# Patient Record
Sex: Male | Born: 1965 | Race: White | Hispanic: No | Marital: Married | Smoking: Never smoker
Health system: Southern US, Community
[De-identification: ages and names within clinical notes are randomized; demographics above are authoritative.]

## PROBLEM LIST (undated history)

## (undated) DIAGNOSIS — E785 Hyperlipidemia, unspecified: Secondary | ICD-10-CM

## (undated) DIAGNOSIS — E559 Vitamin D deficiency, unspecified: Secondary | ICD-10-CM

## (undated) DIAGNOSIS — R0989 Other specified symptoms and signs involving the circulatory and respiratory systems: Secondary | ICD-10-CM

## (undated) DIAGNOSIS — J309 Allergic rhinitis, unspecified: Secondary | ICD-10-CM

## (undated) HISTORY — DX: Vitamin D deficiency, unspecified: E55.9

## (undated) HISTORY — DX: Allergic rhinitis, unspecified: J30.9

## (undated) HISTORY — DX: Hyperlipidemia, unspecified: E78.5

## (undated) HISTORY — DX: Other specified symptoms and signs involving the circulatory and respiratory systems: R09.89

---

## 1984-10-29 HISTORY — PX: ANTERIOR CRUCIATE LIGAMENT REPAIR: SHX115

## 2001-10-09 ENCOUNTER — Ambulatory Visit (HOSPITAL_COMMUNITY): Admission: RE | Admit: 2001-10-09 | Discharge: 2001-10-09 | Payer: Self-pay | Admitting: Gastroenterology

## 2005-10-29 HISTORY — PX: ORIF ANKLE FRACTURE: SUR919

## 2006-09-21 ENCOUNTER — Inpatient Hospital Stay (HOSPITAL_COMMUNITY): Admission: EM | Admit: 2006-09-21 | Discharge: 2006-09-24 | Payer: Self-pay | Admitting: Emergency Medicine

## 2007-12-12 IMAGING — CR DG ANKLE 2V *R*
2 series · 2 of 2 positions shown · non-contrast
Comparison: none

CLINICAL DATA: Right ankle pain and deformity following an injury.

RIGHT ANKLE - 2 VIEW

[view not recorded (1 of 2)]
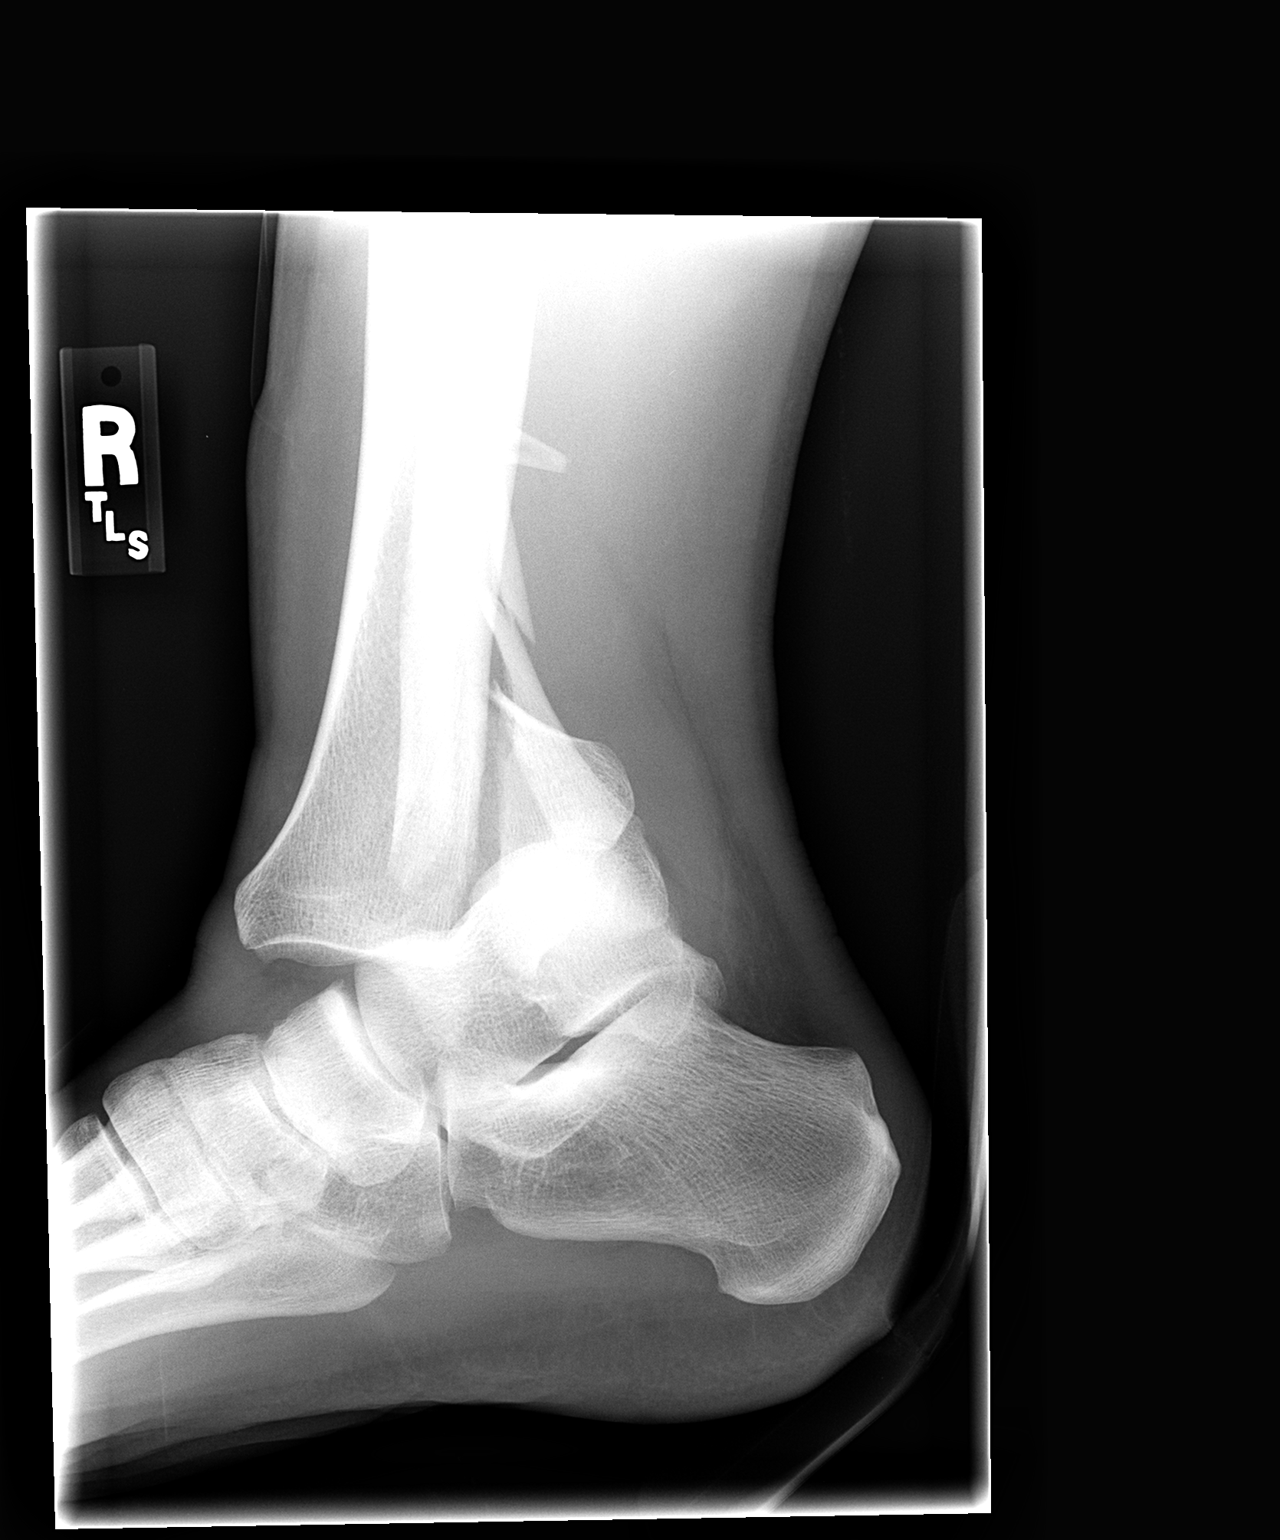

[view not recorded (2 of 2)]
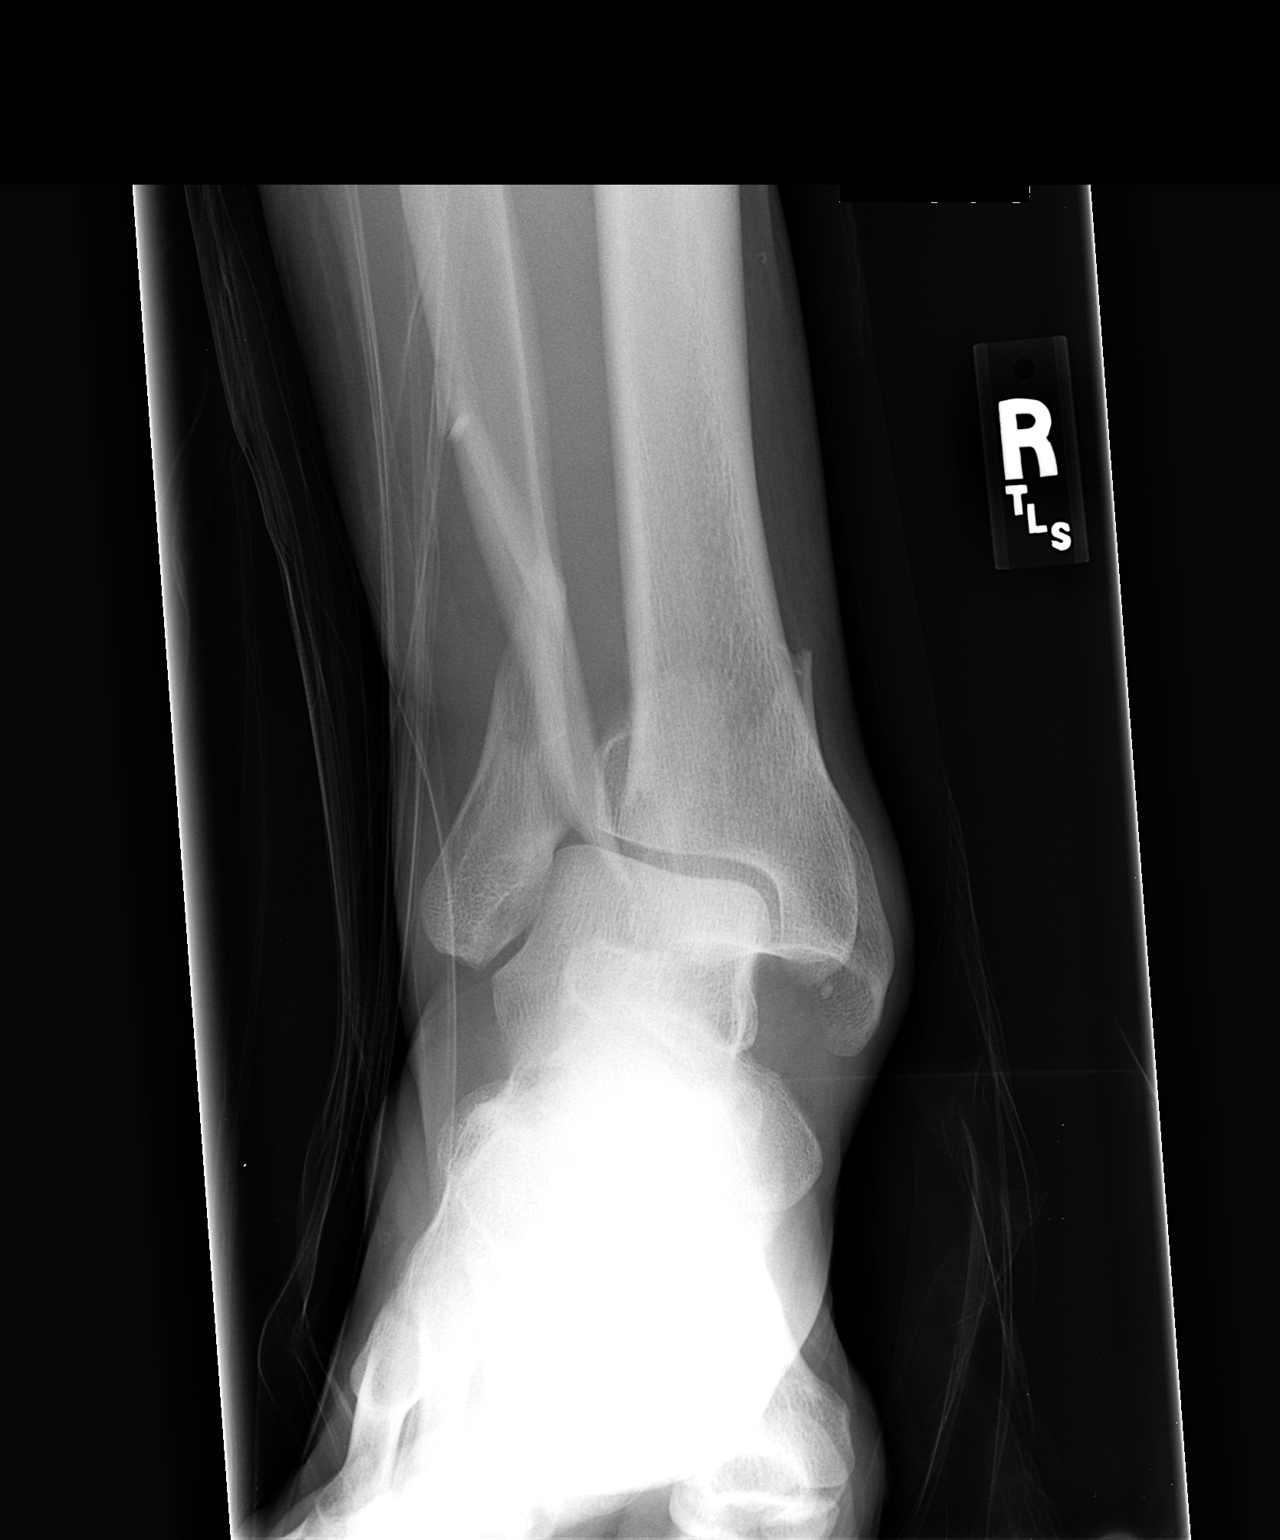

[2 of 2 positions shown; findings below may reference images not displayed]

FINDINGS: Comminuted fracture of the posterior and lateral aspects of the
distal tibial metaphysis and epiphysis with posterior dislocation of the talus
relative to the tibia, with overlapping of the talus and tibia. Comminuted
fracture of the distal shaft of the fibula with more than one shaft width of
posterior displacement as well as posterior displacement and lateral angulation
of the distal fragment. There is also significant overlapping of the fragments.

IMPRESSION

Comminuted distal tibia and fibula fractures as well as a talotibial
dislocation, as described above.

## 2007-12-12 IMAGING — CR DG CHEST 1V PORT
1 series · 1 of 1 positions shown · non-contrast
Comparison: None.

CLINICAL DATA: Ankle fracture. Preoperative evaluation.

PORTABLE CHEST - 1 VIEW

[view not recorded]
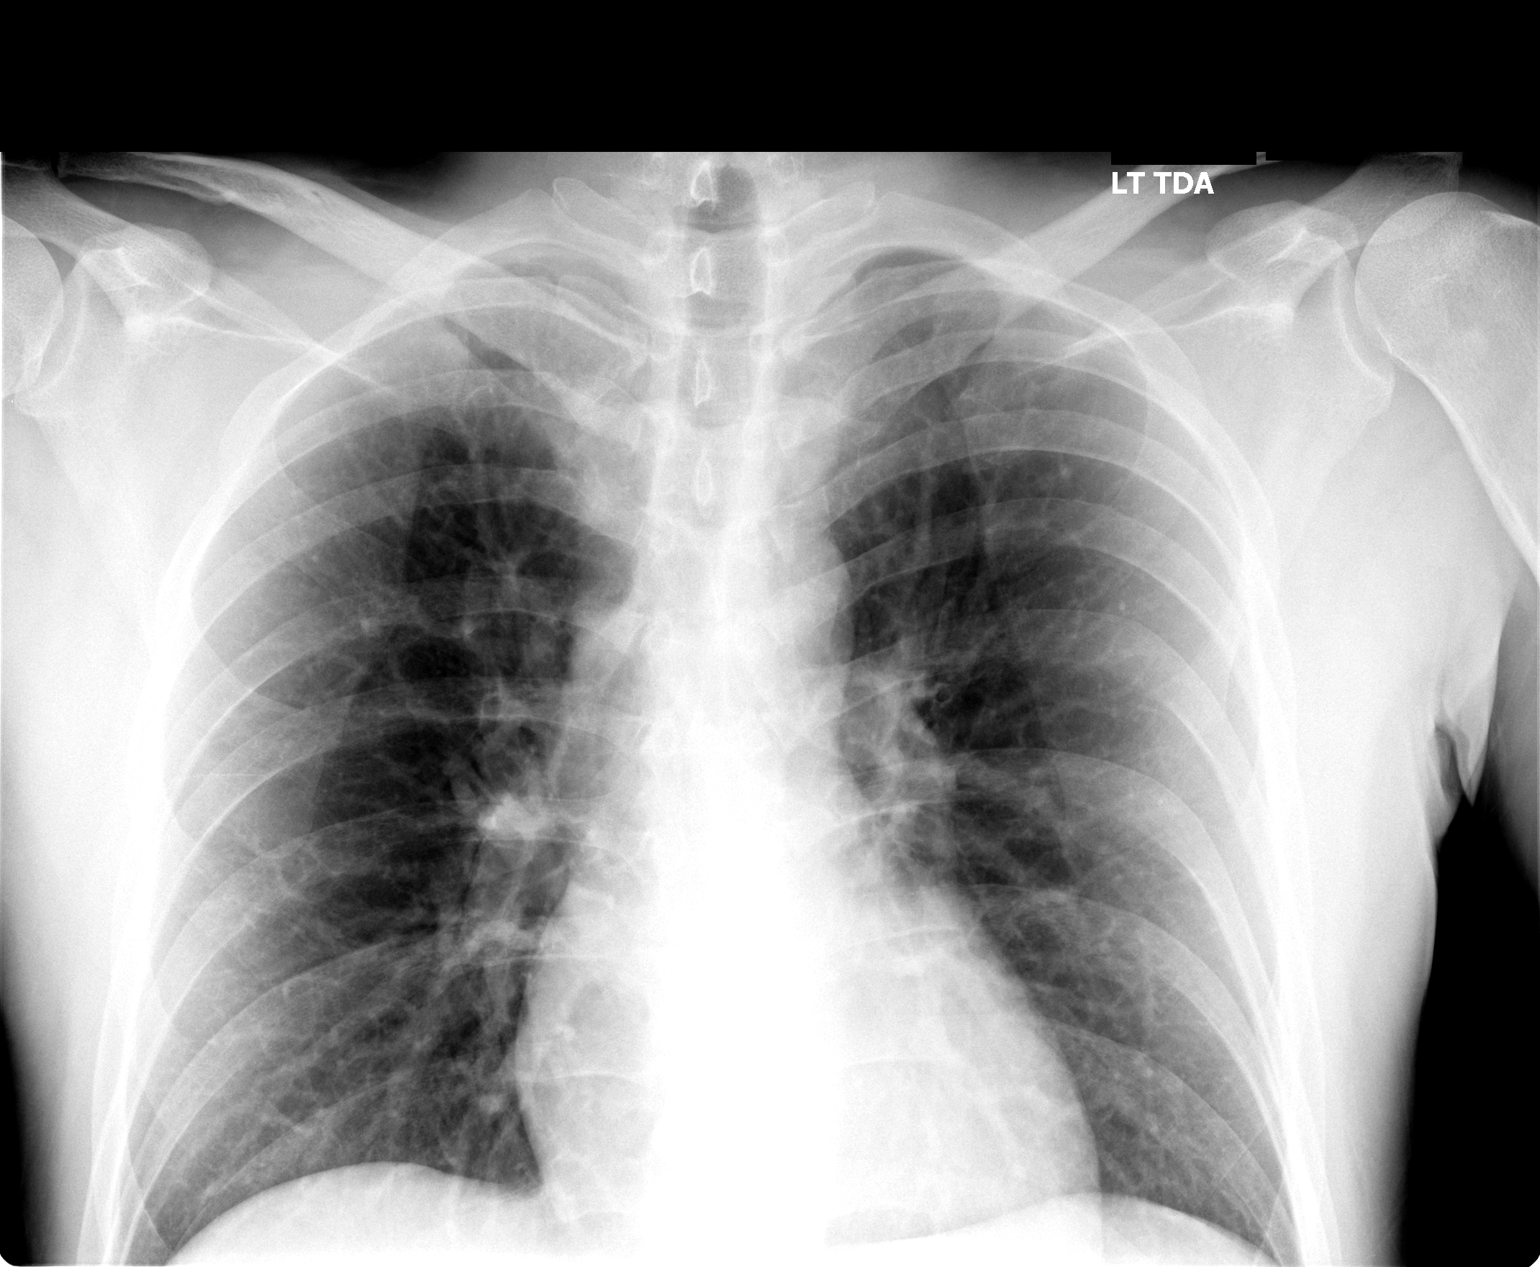

[1 of 1 positions shown; findings below may reference images not displayed]

FINDINGS: The lateral costophrenic angles are not included. The included
portions of the lungs are clear. Mild diffuse peribronchial thickening. Normal
sized heart. Mild scoliosis.

IMPRESSION

Mildly limited examination with no acute abnormality seen. Mild chronic
bronchitic changes.

## 2012-06-24 LAB — HM COLONOSCOPY: HM COLON: NORMAL

## 2013-11-10 ENCOUNTER — Ambulatory Visit: Payer: Self-pay | Admitting: Emergency Medicine

## 2014-08-08 DIAGNOSIS — E559 Vitamin D deficiency, unspecified: Secondary | ICD-10-CM | POA: Insufficient documentation

## 2014-08-08 DIAGNOSIS — R0989 Other specified symptoms and signs involving the circulatory and respiratory systems: Secondary | ICD-10-CM | POA: Insufficient documentation

## 2014-08-08 DIAGNOSIS — J309 Allergic rhinitis, unspecified: Secondary | ICD-10-CM | POA: Insufficient documentation

## 2014-08-08 DIAGNOSIS — E785 Hyperlipidemia, unspecified: Secondary | ICD-10-CM | POA: Insufficient documentation

## 2014-08-11 ENCOUNTER — Encounter: Payer: Self-pay | Admitting: Internal Medicine

## 2014-09-20 ENCOUNTER — Encounter: Payer: Self-pay | Admitting: Internal Medicine

## 2014-09-20 ENCOUNTER — Ambulatory Visit (INDEPENDENT_AMBULATORY_CARE_PROVIDER_SITE_OTHER): Payer: Commercial Managed Care - PPO | Admitting: Internal Medicine

## 2014-09-20 VITALS — BP 134/88 | HR 60 | Temp 97.7°F | Resp 16 | Ht 72.5 in | Wt 191.2 lb

## 2014-09-20 DIAGNOSIS — Z1212 Encounter for screening for malignant neoplasm of rectum: Secondary | ICD-10-CM

## 2014-09-20 DIAGNOSIS — E559 Vitamin D deficiency, unspecified: Secondary | ICD-10-CM

## 2014-09-20 DIAGNOSIS — Z113 Encounter for screening for infections with a predominantly sexual mode of transmission: Secondary | ICD-10-CM

## 2014-09-20 DIAGNOSIS — I10 Essential (primary) hypertension: Secondary | ICD-10-CM

## 2014-09-20 DIAGNOSIS — Z79899 Other long term (current) drug therapy: Secondary | ICD-10-CM

## 2014-09-20 DIAGNOSIS — E785 Hyperlipidemia, unspecified: Secondary | ICD-10-CM

## 2014-09-20 DIAGNOSIS — R7989 Other specified abnormal findings of blood chemistry: Secondary | ICD-10-CM

## 2014-09-20 DIAGNOSIS — Z125 Encounter for screening for malignant neoplasm of prostate: Secondary | ICD-10-CM

## 2014-09-20 DIAGNOSIS — Z111 Encounter for screening for respiratory tuberculosis: Secondary | ICD-10-CM

## 2014-09-20 DIAGNOSIS — R0989 Other specified symptoms and signs involving the circulatory and respiratory systems: Secondary | ICD-10-CM

## 2014-09-20 DIAGNOSIS — Z0001 Encounter for general adult medical examination with abnormal findings: Secondary | ICD-10-CM

## 2014-09-20 DIAGNOSIS — R7309 Other abnormal glucose: Secondary | ICD-10-CM

## 2014-09-20 DIAGNOSIS — R6889 Other general symptoms and signs: Secondary | ICD-10-CM

## 2014-09-20 DIAGNOSIS — R945 Abnormal results of liver function studies: Secondary | ICD-10-CM

## 2014-09-20 DIAGNOSIS — Z23 Encounter for immunization: Secondary | ICD-10-CM

## 2014-09-20 NOTE — Progress Notes (Signed)
Patient ID: Patrick LimingMichael B Stone, male   DOB: 05/29/1966, 48 y.o.   MRN: [id number]  Annual Screening Comprehensive Examination  This very nice 48 y.o.MWM presents for complete physical.  Patient has been followed for HTN,   Prediabetes, Hyperlipidemia, and Vitamin D Deficiency.   Patient has hx/o labile HTN predates since 2007 and has been monitored expectantly. Patient's BP has been controlled and today's BP: 134/88 mmHg. Patient reports [facility]'s have been [facility] 2-3 months and usually about 120/70's. He does exercise about 3-4 x/week. Patient denies any cardiac symptoms as chest pain, palpitations, shortness of breath, dizziness or ankle swelling.   Patient's lipids are controlled with diet and exercise.  Last lipids were Total Chol 188, TG 96, HDL 52 and elevated LDL 117 in Oct 2014.    Patient is screened and monitored for prediabetes and patient denies reactive hypoglycemic symptoms, visual blurring, diabetic polys or paresthesias.  Last A1c was 5.4% in Oct 2014     Finally, patient has history of Vitamin D Deficiency of 27 in 2008  and last vitamin D was 6438 in Oct 2014.   Medication Sig  . Cholecalciferol (VITAMIN D PO) Take by mouth daily.  Marland Kitchen. loratadine (CLARITIN) 10 MG tablet Take 10 mg by mouth daily.  . Multiple Vitamins-Minerals  Take by mouth daily.   No Known Allergies  Past Medical History  Diagnosis Date  . Vitamin D deficiency   . Allergic rhinitis   . Labile hypertension   . Elevated lipids    Health Maintenance  Topic Date Due  . TETANUS/TDAP  08/29/1984  . INFLUENZA VACCINE  05/29/2014   Immunization History  Administered Date(s) Administered  . Influenza Split 09/20/2014  . Influenza-Unspecified 08/07/2013  . PPD Test 09/20/2014   Past Surgical History  Procedure Laterality Date  . Orif ankle fracture Right 2007  . Anterior cruciate ligament repair Left 1986   Family History  Problem Relation Age of Onset  . Depression Mother    . Cancer Father     colon   History   Social History  . Marital Status: Married    Spouse Name: N/A    Number of Children: N/A  . Years of Education: N/A   Occupational History  . 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   Social History Main Topics  . Smoking status: Former Games developermoker  . Smokeless tobacco: Not on file  . Alcohol Use: Yes     Comment: Social  . Drug Use: No  . Sexual Activity: Not on file    ROS Constitutional: Denies fever, chills, weight loss/gain, headaches, insomnia, fatigue, night sweats or change in appetite. Eyes: Denies redness, blurred vision, diplopia, discharge, itchy or watery eyes.  ENT: Denies discharge, congestion, post nasal drip, epistaxis, sore throat, earache, hearing loss, dental pain, Tinnitus, Vertigo, Sinus pain or snoring.  Cardio: Denies chest pain, palpitations, irregular heartbeat, syncope, dyspnea, diaphoresis, orthopnea, PND, claudication or edema Respiratory: denies cough, dyspnea, DOE, pleurisy, hoarseness, laryngitis or wheezing.  Gastrointestinal: Denies dysphagia, heartburn, reflux, water brash, pain, cramps, nausea, vomiting, bloating, diarrhea, constipation, hematemesis, melena, hematochezia, jaundice or hemorrhoids Genitourinary: Denies dysuria, frequency, urgency, nocturia, hesitancy, discharge, hematuria or flank pain Musculoskeletal: Denies arthralgia, myalgia, stiffness, Jt. Swelling, pain, limp or strain/sprain. Denies Falls. Skin: Denies puritis, rash, hives, warts, acne, eczema or change in skin lesion Neuro: No weakness, tremor, incoordination, spasms, paresthesia or pain Psychiatric: Denies confusion, memory loss or sensory loss. Denies Depression. Endocrine: Denies change  in weight, skin, hair change, nocturia, and paresthesia, diabetic polys, visual blurring or hyper / hypo glycemic episodes.  Heme/Lymph: No excessive bleeding, bruising or enlarged lymph nodes.  Physical Exam  BP 134/88   Pulse 60  Temp 97.7  F   Resp 16  Ht 6' 0.5"   Wt 191 lb 3.2 oz   BMI 25.56   General Appearance: Well nourished, in no apparent distress. Eyes: PERRLA, EOMs, conjunctiva no swelling or erythema, normal fundi and vessels. Sinuses: No frontal/maxillary tenderness ENT/Mouth: EACs patent / TMs  nl. Nares clear without erythema, swelling, mucoid exudates. Oral hygiene is good. No erythema, swelling, or exudate. Tongue normal, non-obstructing. Tonsils not swollen or erythematous. Hearing normal.  Neck: Supple, thyroid normal. No bruits, nodes or JVD. Respiratory: Respiratory effort normal.  BS equal and clear bilateral without rales, rhonci, wheezing or stridor. Cardio: Heart sounds are normal with regular rate and rhythm and no murmurs, rubs or gallops. Peripheral pulses are normal and equal bilaterally without edema. No aortic or femoral bruits. Chest: symmetric with normal excursions and percussion.  Abdomen: Flat, soft, with bowl sounds. Nontender, no guarding, rebound, hernias, masses, or organomegaly.  Lymphatics: Non tender without lymphadenopathy.  Genitourinary: No hernias.Testes nl. DRE - prostate nl for age - smooth & firm w/o nodules. Musculoskeletal: Full ROM all peripheral extremities, joint stability, 5/5 strength, and normal gait. Skin: Warm and dry without rashes, lesions, cyanosis, clubbing or  ecchymosis.  Neuro: Cranial nerves intact, reflexes equal bilaterally. Normal muscle tone, no cerebellar symptoms. Sensation intact.  Pysch: Awake and oriented X 3with normal affect, insight and judgment appropriate.   Assessment and Plan  1. Annual Screening Examination 2. Hypertension, labile - monitoring expectantly  3. Hyperlipidemia, screening 4. Pre Diabetes, screening 5. Vitamin D Deficiency   Continue prudent diet as discussed, weight control, BP monitoring, regular exercise, and medications as discussed.  Discussed med effects and SE's. Routine screening labs and tests as requested with regular  follow-up as recommended.

## 2014-09-20 NOTE — Patient Instructions (Signed)

## 2014-09-21 LAB — CBC WITH DIFFERENTIAL/PLATELET
BASOS ABS: 0 10*3/uL (ref 0.0–0.1)
BASOS PCT: 0 % (ref 0–1)
Eosinophils Absolute: 0.1 10*3/uL (ref 0.0–0.7)
Eosinophils Relative: 2 % (ref 0–5)
HCT: 37.3 % — ABNORMAL LOW (ref 39.0–52.0)
HEMOGLOBIN: 13.1 g/dL (ref 13.0–17.0)
Lymphocytes Relative: 29 % (ref 12–46)
Lymphs Abs: 1.5 10*3/uL (ref 0.7–4.0)
MCH: 31.1 pg (ref 26.0–34.0)
MCHC: 35.1 g/dL (ref 30.0–36.0)
MCV: 88.6 fL (ref 78.0–100.0)
MPV: 10.2 fL (ref 9.4–12.4)
Monocytes Absolute: 0.4 10*3/uL (ref 0.1–1.0)
Monocytes Relative: 7 % (ref 3–12)
NEUTROS ABS: 3.3 10*3/uL (ref 1.7–7.7)
NEUTROS PCT: 62 % (ref 43–77)
PLATELETS: 258 10*3/uL (ref 150–400)
RBC: 4.21 MIL/uL — ABNORMAL LOW (ref 4.22–5.81)
RDW: 12.8 % (ref 11.5–15.5)
WBC: 5.3 10*3/uL (ref 4.0–10.5)

## 2014-09-21 LAB — BASIC METABOLIC PANEL WITH GFR
BUN: 18 mg/dL (ref 6–23)
CALCIUM: 9.3 mg/dL (ref 8.4–10.5)
CO2: 24 mEq/L (ref 19–32)
Chloride: 102 mEq/L (ref 96–112)
Creat: 0.93 mg/dL (ref 0.50–1.35)
GFR, Est Non African American: >89 mL/min
Glucose, Bld: 94 mg/dL (ref 70–99)
Potassium: 4.6 mEq/L (ref 3.5–5.3)
SODIUM: 138 meq/L (ref 135–145)

## 2014-09-21 LAB — HEPATIC FUNCTION PANEL
ALT: 14 U/L (ref 0–53)
AST: 20 U/L (ref 0–37)
Albumin: 4.3 g/dL (ref 3.5–5.2)
Alkaline Phosphatase: 45 U/L (ref 39–117)
BILIRUBIN DIRECT: 0.1 mg/dL (ref 0.0–0.3)
BILIRUBIN TOTAL: 0.7 mg/dL (ref 0.2–1.2)
Indirect Bilirubin: 0.6 mg/dL (ref 0.2–1.2)
Total Protein: 6.9 g/dL (ref 6.0–8.3)

## 2014-09-21 LAB — URINALYSIS, MICROSCOPIC ONLY
Bacteria, UA: NONE SEEN
Casts: NONE SEEN
Crystals: NONE SEEN
Squamous Epithelial / LPF: NONE SEEN

## 2014-09-21 LAB — HEPATITIS A ANTIBODY, TOTAL: Hep A Total Ab: BORDERLINE — AB

## 2014-09-21 LAB — LIPID PANEL
CHOL/HDL RATIO: 3.4 ratio
CHOLESTEROL: 182 mg/dL (ref 0–200)
HDL: 53 mg/dL (ref >39)
LDL Cholesterol: 104 mg/dL — ABNORMAL HIGH (ref 0–99)
Triglycerides: 127 mg/dL (ref <150)
VLDL: 25 mg/dL (ref 0–40)

## 2014-09-21 LAB — MICROALBUMIN / CREATININE URINE RATIO: CREATININE, URINE: 55.3 mg/dL

## 2014-09-21 LAB — HEPATITIS B SURFACE ANTIBODY,QUALITATIVE: HEP B S AB: NEGATIVE

## 2014-09-21 LAB — PSA: PSA: 0.69 ng/mL (ref <=4.00)

## 2014-09-21 LAB — HEMOGLOBIN A1C
HEMOGLOBIN A1C: 5.9 % — AB (ref <5.7)
Mean Plasma Glucose: 123 mg/dL — ABNORMAL HIGH (ref <117)

## 2014-09-21 LAB — RPR

## 2014-09-21 LAB — MAGNESIUM: MAGNESIUM: 1.9 mg/dL (ref 1.5–2.5)

## 2014-09-21 LAB — HEPATITIS B CORE ANTIBODY, TOTAL: Hep B Core Total Ab: NONREACTIVE

## 2014-09-21 LAB — INSULIN, FASTING: INSULIN FASTING, SERUM: 15.7 u[IU]/mL (ref 2.0–19.6)

## 2014-09-21 LAB — TSH: TSH: 1.135 u[IU]/mL (ref 0.350–4.500)

## 2014-09-21 LAB — VITAMIN B12: Vitamin B-12: 574 pg/mL (ref 211–911)

## 2014-09-21 LAB — VITAMIN D 25 HYDROXY (VIT D DEFICIENCY, FRACTURES): VIT D 25 HYDROXY: 48 ng/mL (ref 30–100)

## 2014-09-21 LAB — HIV ANTIBODY (ROUTINE TESTING W REFLEX): HIV: NONREACTIVE

## 2014-09-21 LAB — HEPATITIS C ANTIBODY: HCV Ab: NEGATIVE

## 2014-09-21 LAB — TESTOSTERONE: Testosterone: 310 ng/dL (ref 300–890)

## 2014-09-25 LAB — HEPATITIS B E ANTIBODY: HEPATITIS BE ANTIBODY: NONREACTIVE

## 2014-10-07 LAB — TB SKIN TEST
Induration: 0 mm
TB SKIN TEST: NEGATIVE

## 2015-08-22 ENCOUNTER — Encounter: Payer: Self-pay | Admitting: Internal Medicine

## 2015-10-05 ENCOUNTER — Encounter: Payer: Self-pay | Admitting: Internal Medicine

## 2015-10-05 ENCOUNTER — Ambulatory Visit (INDEPENDENT_AMBULATORY_CARE_PROVIDER_SITE_OTHER): Payer: BLUE CROSS/BLUE SHIELD | Admitting: Internal Medicine

## 2015-10-05 VITALS — BP 118/72 | HR 72 | Temp 97.7°F | Resp 16 | Ht 72.0 in | Wt 182.0 lb

## 2015-10-05 DIAGNOSIS — Z6824 Body mass index (BMI) 24.0-24.9, adult: Secondary | ICD-10-CM

## 2015-10-05 DIAGNOSIS — E663 Overweight: Secondary | ICD-10-CM | POA: Insufficient documentation

## 2015-10-05 DIAGNOSIS — Z0000 Encounter for general adult medical examination without abnormal findings: Secondary | ICD-10-CM | POA: Diagnosis not present

## 2015-10-05 DIAGNOSIS — R5383 Other fatigue: Secondary | ICD-10-CM

## 2015-10-05 DIAGNOSIS — Z79899 Other long term (current) drug therapy: Secondary | ICD-10-CM | POA: Diagnosis not present

## 2015-10-05 DIAGNOSIS — I10 Essential (primary) hypertension: Secondary | ICD-10-CM

## 2015-10-05 DIAGNOSIS — Z23 Encounter for immunization: Secondary | ICD-10-CM

## 2015-10-05 DIAGNOSIS — R0989 Other specified symptoms and signs involving the circulatory and respiratory systems: Secondary | ICD-10-CM

## 2015-10-05 DIAGNOSIS — Z0001 Encounter for general adult medical examination with abnormal findings: Secondary | ICD-10-CM

## 2015-10-05 DIAGNOSIS — Z125 Encounter for screening for malignant neoplasm of prostate: Secondary | ICD-10-CM

## 2015-10-05 DIAGNOSIS — E559 Vitamin D deficiency, unspecified: Secondary | ICD-10-CM

## 2015-10-05 DIAGNOSIS — Z111 Encounter for screening for respiratory tuberculosis: Secondary | ICD-10-CM | POA: Diagnosis not present

## 2015-10-05 DIAGNOSIS — R7309 Other abnormal glucose: Secondary | ICD-10-CM

## 2015-10-05 DIAGNOSIS — Z1212 Encounter for screening for malignant neoplasm of rectum: Secondary | ICD-10-CM

## 2015-10-05 DIAGNOSIS — E785 Hyperlipidemia, unspecified: Secondary | ICD-10-CM

## 2015-10-05 LAB — CBC WITH DIFFERENTIAL/PLATELET
BASOS ABS: 0.1 10*3/uL (ref 0.0–0.1)
Basophils Relative: 1 % (ref 0–1)
Eosinophils Absolute: 0.1 10*3/uL (ref 0.0–0.7)
Eosinophils Relative: 2 % (ref 0–5)
HEMATOCRIT: 38 % — AB (ref 39.0–52.0)
HEMOGLOBIN: 12.9 g/dL — AB (ref 13.0–17.0)
LYMPHS ABS: 1.5 10*3/uL (ref 0.7–4.0)
LYMPHS PCT: 28 % (ref 12–46)
MCH: 30.9 pg (ref 26.0–34.0)
MCHC: 33.9 g/dL (ref 30.0–36.0)
MCV: 90.9 fL (ref 78.0–100.0)
MONO ABS: 0.4 10*3/uL (ref 0.1–1.0)
MONOS PCT: 8 % (ref 3–12)
MPV: 10.8 fL (ref 8.6–12.4)
NEUTROS ABS: 3.3 10*3/uL (ref 1.7–7.7)
Neutrophils Relative %: 61 % (ref 43–77)
Platelets: 223 10*3/uL (ref 150–400)
RBC: 4.18 MIL/uL — AB (ref 4.22–5.81)
RDW: 13 % (ref 11.5–15.5)
WBC: 5.4 10*3/uL (ref 4.0–10.5)

## 2015-10-05 LAB — TSH: TSH: 1.625 u[IU]/mL (ref 0.350–4.500)

## 2015-10-05 LAB — VITAMIN B12: VITAMIN B 12: 420 pg/mL (ref 211–911)

## 2015-10-05 NOTE — Patient Instructions (Signed)

## 2015-10-05 NOTE — Progress Notes (Signed)
Patient ID: Patrick LimingMichael B Evora, male   DOB: 09/01/1966, 49 y.o.   MRN: [id number]  Annual  Screening/Preventative Visit And Comprehensive Evaluation & Examination  This very nice 49 y.o. MWM presents for presents for a Wellness/Preventative Visit & comprehensive evaluation and management of multiple medical co-morbidities.  Patient has been followed for Labile HTN, Prediabetes, Hyperlipidemia and Vitamin D Deficiency. Patient also has hx/o GERD only very infrequently symptomatic and usually controlled with TUMs.      Labile HTN predates since 2006 and patient is followed expectantly. Patient's BP has been controlled and today's BP: 118/72 mmHg. Patient denies any cardiac symptoms as chest pain, palpitations, shortness of breath, dizziness or ankle swelling.  Patient exercises 3-5 x/week.      Patient's hyperlipidemia is controlled with diet. Patient denies myalgias or other medication SE's. Last lipids were near  goal with Total Chol 182, HDL 53, Trig 127 & LDL 104 in Nov 2015.      Patient has  prediabetes  and patient denies reactive hypoglycemic symptoms, visual blurring, diabetic polys or paresthesias. Last A1c was 5.9% in Non 2015.       Finally, patient has history of Vitamin D Deficiency of 27 in 2008 and last vitamin D was 48 in Nov 2015.    Medication Sig  . Cholecalciferol (VITAMIN D PO) Take by mouth daily.  Marland Kitchen. loratadine (CLARITIN) 10 MG tablet Take 10 mg by mouth daily.  . Multiple Vitamins-Minerals (MULTIVITAMIN PO) Take by mouth daily.   No Known Allergies   Past Medical History  Diagnosis Date  . Vitamin D deficiency   . Allergic rhinitis   . Labile hypertension   . Elevated lipids    Health Maintenance  Topic Date Due  . TETANUS/TDAP  08/29/1984  . INFLUENZA VACCINE  05/30/2015  . COLONOSCOPY  06/24/2022  . HIV Screening  Completed   Immunization History  Administered Date(s) Administered  . Influenza Split 09/20/2014  . Influenza-Unspecified 08/07/2013  . PPD Test  09/20/2014   Past Surgical History  Procedure Laterality Date  . Orif ankle fracture Right 2007  . Anterior cruciate ligament repair Left 1986   Social History   Social History  . Marital Status: Married    Spouse Name: N/A  . Number of Children: N/A  . Years of Education: N/A   Occupational History  . 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 Smoking status: Former Games developermoker  . Smokeless tobacco: No  . Alcohol Use: Yes     Comment: Social  . Drug Use: No  . Sexual Activity: Active    ROS Constitutional: Denies fever, chills, weight loss/gain, headaches, insomnia,  night sweats or change in appetite. Does c/o fatigue. Eyes: Denies redness, blurred vision, diplopia, discharge, itchy or watery eyes.  ENT: Denies discharge, congestion, post nasal drip, epistaxis, sore throat, earache, hearing loss, dental pain, Tinnitus, Vertigo, Sinus pain or snoring.  Cardio: Denies chest pain, palpitations, irregular heartbeat, syncope, dyspnea, diaphoresis, orthopnea, PND, claudication or edema Respiratory: denies cough, dyspnea, DOE, pleurisy, hoarseness, laryngitis or wheezing.  Gastrointestinal: Denies dysphagia, reflux, water brash, pain, cramps, nausea, vomiting, bloating, diarrhea, constipation, hematemesis, melena, hematochezia, jaundice or hemorrhoids. Has infreq heartburn. Genitourinary: Denies dysuria, frequency, urgency, nocturia, hesitancy, discharge, hematuria or flank pain Musculoskeletal: Denies arthralgia, myalgia, stiffness, Jt. Swelling, pain, limp or strain/sprain. Denies Falls. Skin: Denies puritis, rash, hives, warts, acne, eczema or change in skin lesion Neuro: No weakness, tremor, incoordination, spasms, paresthesia or pain  Psychiatric: Denies confusion, memory loss or sensory loss. Denies Depression. Endocrine: Denies change in weight, skin, hair change, nocturia, and paresthesia, diabetic polys, visual blurring or hyper / hypo glycemic  episodes.  Heme/Lymph: No excessive bleeding, bruising or enlarged lymph nodes.  Physical Exam  BP 118/72 mmHg  Pulse 72  Temp(Src) 97.7 F (36.5 C)  Resp 16  Ht 6' (1.829 m)  Wt 182 lb (82.555 kg)  BMI 24.68 kg/m2  General Appearance: Well nourished, in no apparent distress. Eyes: PERRLA, EOMs, conjunctiva no swelling or erythema, normal fundi and vessels. Sinuses: No frontal/maxillary tenderness ENT/Mouth: EACs patent / TMs  nl. Nares clear without erythema, swelling, mucoid exudates. Oral hygiene is good. No erythema, swelling, or exudate. Tongue normal, non-obstructing. Tonsils not swollen or erythematous. Hearing normal.  Neck: Supple, thyroid normal. No bruits, nodes or JVD. Respiratory: Respiratory effort normal.  BS equal and clear bilateral without rales, rhonci, wheezing or stridor. Cardio: Heart sounds are normal with regular rate and rhythm and no murmurs, rubs or gallops. Peripheral pulses are normal and equal bilaterally without edema. No aortic or femoral bruits. Chest: symmetric with normal excursions and percussion.  Abdomen: Flat, soft, with bowl sounds. Nontender, no guarding, rebound, hernias, masses, or organomegaly.  Lymphatics: Non tender without lymphadenopathy.  Genitourinary: No hernias.Testes nl. DRE - prostate nl for age - smooth & firm w/o nodules. Musculoskeletal: Full ROM all peripheral extremities, joint stability, 5/5 strength, and normal gait. Skin: Warm and dry without rashes, lesions, cyanosis, clubbing or  ecchymosis.  Neuro: Cranial nerves intact, reflexes equal bilaterally. Normal muscle tone, no cerebellar symptoms. Sensation intact.  Pysch: Awake and oriented X 3 with normal affect, insight and judgment appropriate.   Assessment and Plan  1. Annual Preventative/Screening Exam   - Microalbumin / creatinine urine ratio - EKG 12-Lead - [place], RETROPERITNL ABD,  LTD - Urinalysis, Routine w reflex microscopic  - Vitamin B12 - Iron and TIBC -  PSA - Testosterone - CBC with Differential/Platelet - BASIC METABOLIC PANEL WITH GFR - Hepatic function panel - Magnesium - Lipid panel - TSH - Hemoglobin A1c - Insulin, random - VITAMIN D 25 Hydroxy   2. Labile hypertension  - Microalbumin / creatinine urine ratio - EKG 12-Lead - [place], RETROPERITNL ABD,  LTD - TSH  3. Elevated lipids  - Lipid panel - TSH  4. Abnormal glucose  - Hemoglobin A1c - Insulin, random  5. Vitamin D deficiency  - VITAMIN D 25 Hydroxy   6. Screening for rectal cancer   7. Prostate cancer screening  - PSA  8. Other fatigue  - Vitamin B12 - Iron and TIBC - Testosterone  9. Body mass index (BMI) of 24.0-24.9 in adult   10. Need for prophylactic vaccination and inoculation against influenza  - Flu vaccine greater than or equal to 3yo preservative free IM  11. Need for prophylactic vaccination with combined diphtheria-tetanus-pertussis (DTP) vaccine  - Tdap vaccine greater than or equal to 7yo IM  12. Screening examination for pulmonary tuberculosis  - PPD  13. Medication management  - Urinalysis, Routine w reflex microscopic (not at [facility]) - CBC with Differential/Platelet - BASIC METABOLIC PANEL WITH GFR - Hepatic function panel - Magnesium   Continue prudent diet as discussed, weight control, BP monitoring, regular exercise, and medications as discussed.  Discussed med effects and SE's. Routine screening labs and tests as requested with regular follow-up as recommended. Over 40 minutes of exam, counseling, chart review and high complex critical decision making was performed

## 2015-10-06 LAB — URINALYSIS, ROUTINE W REFLEX MICROSCOPIC
Bilirubin Urine: NEGATIVE
Glucose, UA: NEGATIVE
Hgb urine dipstick: NEGATIVE
Ketones, ur: NEGATIVE
LEUKOCYTES UA: NEGATIVE
NITRITE: NEGATIVE
PROTEIN: NEGATIVE
Specific Gravity, Urine: 1.022 (ref 1.001–1.035)
pH: 5.5 (ref 5.0–8.0)

## 2015-10-06 LAB — MICROALBUMIN / CREATININE URINE RATIO
Creatinine, Urine: 147 mg/dL (ref 20–370)
MICROALB/CREAT RATIO: 1 ug/mg{creat} (ref <30)
Microalb, Ur: 0.2 mg/dL

## 2015-10-06 LAB — BASIC METABOLIC PANEL WITH GFR
BUN: 21 mg/dL (ref 7–25)
CHLORIDE: 103 mmol/L (ref 98–110)
CO2: 25 mmol/L (ref 20–31)
Calcium: 9.2 mg/dL (ref 8.6–10.3)
Creat: 0.96 mg/dL (ref 0.70–1.33)
GLUCOSE: 82 mg/dL (ref 65–99)
POTASSIUM: 4.3 mmol/L (ref 3.5–5.3)
Sodium: 139 mmol/L (ref 135–146)

## 2015-10-06 LAB — HEMOGLOBIN A1C
Hgb A1c MFr Bld: 5.7 % — ABNORMAL HIGH (ref <5.7)
Mean Plasma Glucose: 117 mg/dL — ABNORMAL HIGH (ref <117)

## 2015-10-06 LAB — HEPATIC FUNCTION PANEL
ALK PHOS: 41 U/L (ref 40–115)
ALT: 20 U/L (ref 9–46)
AST: 22 U/L (ref 10–35)
Albumin: 3.9 g/dL (ref 3.6–5.1)
BILIRUBIN INDIRECT: 0.6 mg/dL (ref 0.2–1.2)
Bilirubin, Direct: 0.1 mg/dL (ref <=0.2)
TOTAL PROTEIN: 6.6 g/dL (ref 6.1–8.1)
Total Bilirubin: 0.7 mg/dL (ref 0.2–1.2)

## 2015-10-06 LAB — LIPID PANEL
CHOLESTEROL: 178 mg/dL (ref 125–200)
HDL: 62 mg/dL (ref >=40)
LDL Cholesterol: 93 mg/dL (ref <130)
TRIGLYCERIDES: 116 mg/dL (ref <150)
Total CHOL/HDL Ratio: 2.9 Ratio (ref <=5.0)
VLDL: 23 mg/dL (ref <30)

## 2015-10-06 LAB — MAGNESIUM: Magnesium: 2 mg/dL (ref 1.5–2.5)

## 2015-10-06 LAB — PSA: PSA: 0.66 ng/mL (ref <=4.00)

## 2015-10-06 LAB — INSULIN, RANDOM: Insulin: 13 u[IU]/mL (ref 2.0–19.6)

## 2015-10-06 LAB — IRON AND TIBC
%SAT: 26 % (ref 15–60)
IRON: 93 ug/dL (ref 50–180)
TIBC: 362 ug/dL (ref 250–425)
UIBC: 269 ug/dL (ref 125–400)

## 2015-10-06 LAB — VITAMIN D 25 HYDROXY (VIT D DEFICIENCY, FRACTURES): Vit D, 25-Hydroxy: 45 ng/mL (ref 30–100)

## 2015-10-06 LAB — TESTOSTERONE: Testosterone: 404 ng/dL (ref 300–890)

## 2015-10-09 ENCOUNTER — Encounter: Payer: Self-pay | Admitting: Internal Medicine

## 2015-10-20 ENCOUNTER — Encounter: Payer: Self-pay | Admitting: Internal Medicine

## 2015-10-20 ENCOUNTER — Ambulatory Visit (INDEPENDENT_AMBULATORY_CARE_PROVIDER_SITE_OTHER): Payer: BLUE CROSS/BLUE SHIELD | Admitting: Internal Medicine

## 2015-10-20 VITALS — BP 122/86 | HR 68 | Temp 98.0°F | Resp 16 | Ht 72.0 in | Wt 183.0 lb

## 2015-10-20 DIAGNOSIS — J069 Acute upper respiratory infection, unspecified: Secondary | ICD-10-CM

## 2015-10-20 MED ORDER — PROMETHAZINE-DM 6.25-15 MG/5ML PO SYRP: ORAL_SOLUTION | ORAL | Status: DC

## 2015-10-20 MED ORDER — FLUTICASONE PROPIONATE 50 MCG/ACT NA SUSP: 2.0000 | Freq: Every day | NASAL | Status: DC

## 2015-10-20 MED ORDER — PREDNISONE 20 MG PO TABS: ORAL_TABLET | ORAL | Status: DC

## 2015-10-20 NOTE — Progress Notes (Signed)
Patient ID: Patrick LimingMichael B Stone, male   DOB: 04/13/1966, 49 y.o.   MRN: [id number]  HPI  Patient presents to the office for evaluation of cough.  It has been going on for 10 days.  Patient reports night > day, dry, worse with lying down.  They also endorse change in voice, postnasal drip and nasal congestion, rhinorrhea, clear nasal sputum, sinus pressure, ear fullness.  They have tried mucinex and advil cold and sinus.  They report that nothing has worked.  They denies other sick contacts.  Review of Systems  Constitutional: Negative for fever, chills and malaise/fatigue.  HENT: Positive for congestion. Negative for ear pain and sore throat.   Respiratory: Positive for cough. Negative for sputum production, shortness of breath and wheezing.   Cardiovascular: Negative for chest pain, palpitations and leg swelling.  Neurological: Negative for headaches.    PE:  Filed Vitals:   10/20/15 1116  BP: 122/86  Pulse: 68  Temp: 98 F (36.7 C)  Resp: 16    General:  Alert and non-toxic, WDWN, NAD HEENT: NCAT, PERLA, EOM normal, no occular discharge or erythema.  Nasal mucosal edema with sinus tenderness to palpation.  Oropharynx clear with minimal oropharyngeal edema and erythema.  Mucous membranes moist and pink. Neck:  Cervical adenopathy Chest:  RRR no MRGs.  Lungs clear to auscultation A&P with no wheezes rhonchi or rales.   Abdomen: +BS x 4 quadrants, soft, non-tender, no guarding, rigidity, or rebound. Skin: warm and dry no rash Neuro: A&Ox4, CN II-XII grossly intact  Assessment and Plan:   1. Acute URI -nasal saline -phenergan dextromethorphan -claritin -prednisone -f/u prn

## 2015-10-20 NOTE — Patient Instructions (Signed)
Please start taking claritin daily again.  Please use flonase 2 sprays per nostril before bedtime until congestion resolves.  Please use cough syrup as needed.  Please take prednisone to help with symptoms.  You can stop it if you are feeling better.  Please use saline in your nose as often as possible.

## 2016-12-10 ENCOUNTER — Encounter: Payer: Self-pay | Admitting: Internal Medicine

## 2016-12-10 ENCOUNTER — Ambulatory Visit (INDEPENDENT_AMBULATORY_CARE_PROVIDER_SITE_OTHER): Payer: BLUE CROSS/BLUE SHIELD | Admitting: Internal Medicine

## 2016-12-10 VITALS — BP 122/72 | HR 72 | Temp 97.8°F | Resp 16 | Ht 73.0 in | Wt 195.4 lb

## 2016-12-10 DIAGNOSIS — Z79899 Other long term (current) drug therapy: Secondary | ICD-10-CM | POA: Diagnosis not present

## 2016-12-10 DIAGNOSIS — Z125 Encounter for screening for malignant neoplasm of prostate: Secondary | ICD-10-CM

## 2016-12-10 DIAGNOSIS — E559 Vitamin D deficiency, unspecified: Secondary | ICD-10-CM

## 2016-12-10 DIAGNOSIS — Z111 Encounter for screening for respiratory tuberculosis: Secondary | ICD-10-CM | POA: Diagnosis not present

## 2016-12-10 DIAGNOSIS — R0989 Other specified symptoms and signs involving the circulatory and respiratory systems: Secondary | ICD-10-CM

## 2016-12-10 DIAGNOSIS — Z0001 Encounter for general adult medical examination with abnormal findings: Secondary | ICD-10-CM

## 2016-12-10 DIAGNOSIS — I10 Essential (primary) hypertension: Secondary | ICD-10-CM

## 2016-12-10 DIAGNOSIS — Z0000 Encounter for general adult medical examination without abnormal findings: Secondary | ICD-10-CM

## 2016-12-10 DIAGNOSIS — R7309 Other abnormal glucose: Secondary | ICD-10-CM

## 2016-12-10 DIAGNOSIS — E782 Mixed hyperlipidemia: Secondary | ICD-10-CM

## 2016-12-10 DIAGNOSIS — Z1212 Encounter for screening for malignant neoplasm of rectum: Secondary | ICD-10-CM

## 2016-12-10 DIAGNOSIS — Z136 Encounter for screening for cardiovascular disorders: Secondary | ICD-10-CM | POA: Diagnosis not present

## 2016-12-10 DIAGNOSIS — R5383 Other fatigue: Secondary | ICD-10-CM

## 2016-12-10 LAB — CBC WITH DIFFERENTIAL/PLATELET
BASOS ABS: 38 {cells}/uL (ref 0–200)
Basophils Relative: 1 %
EOS ABS: 114 {cells}/uL (ref 15–500)
EOS PCT: 3 %
HCT: 37.7 % — ABNORMAL LOW (ref 38.5–50.0)
HEMOGLOBIN: 12.5 g/dL — AB (ref 13.2–17.1)
Lymphocytes Relative: 29 %
Lymphs Abs: 1102 cells/uL (ref 850–3900)
MCH: 31.2 pg (ref 27.0–33.0)
MCHC: 33.2 g/dL (ref 32.0–36.0)
MCV: 94 fL (ref 80.0–100.0)
MONOS PCT: 9 %
MPV: 10.8 fL (ref 7.5–12.5)
Monocytes Absolute: 342 cells/uL (ref 200–950)
NEUTROS ABS: 2204 {cells}/uL (ref 1500–7800)
Neutrophils Relative %: 58 %
PLATELETS: 212 10*3/uL (ref 140–400)
RBC: 4.01 MIL/uL — ABNORMAL LOW (ref 4.20–5.80)
RDW: 13.5 % (ref 11.0–15.0)
WBC: 3.8 10*3/uL (ref 3.8–10.8)

## 2016-12-10 LAB — BASIC METABOLIC PANEL WITH GFR
BUN: 21 mg/dL (ref 7–25)
CALCIUM: 9.4 mg/dL (ref 8.6–10.3)
CHLORIDE: 105 mmol/L (ref 98–110)
CO2: 27 mmol/L (ref 20–31)
CREATININE: 1.03 mg/dL (ref 0.70–1.33)
GFR, Est African American: >89 mL/min (ref >=60)
GFR, Est Non African American: 84 mL/min (ref >=60)
Glucose, Bld: 98 mg/dL (ref 65–99)
Potassium: 4.7 mmol/L (ref 3.5–5.3)
Sodium: 138 mmol/L (ref 135–146)

## 2016-12-10 LAB — HEPATIC FUNCTION PANEL
ALBUMIN: 4 g/dL (ref 3.6–5.1)
ALT: 15 U/L (ref 9–46)
AST: 20 U/L (ref 10–35)
Alkaline Phosphatase: 37 U/L — ABNORMAL LOW (ref 40–115)
Bilirubin, Direct: 0.1 mg/dL (ref <=0.2)
Indirect Bilirubin: 0.6 mg/dL (ref 0.2–1.2)
TOTAL PROTEIN: 6.4 g/dL (ref 6.1–8.1)
Total Bilirubin: 0.7 mg/dL (ref 0.2–1.2)

## 2016-12-10 LAB — LIPID PANEL
CHOLESTEROL: 177 mg/dL (ref <200)
HDL: 62 mg/dL (ref >40)
LDL Cholesterol: 95 mg/dL (ref <100)
Total CHOL/HDL Ratio: 2.9 Ratio (ref <5.0)
Triglycerides: 100 mg/dL (ref <150)
VLDL: 20 mg/dL (ref <30)

## 2016-12-10 LAB — IRON AND TIBC
%SAT: 23 % (ref 15–60)
IRON: 81 ug/dL (ref 50–180)
TIBC: 358 ug/dL (ref 250–425)
UIBC: 277 ug/dL (ref 125–400)

## 2016-12-10 LAB — VITAMIN B12: VITAMIN B 12: 450 pg/mL (ref 200–1100)

## 2016-12-10 LAB — PSA: PSA: 0.8 ng/mL (ref <=4.0)

## 2016-12-10 LAB — TSH: TSH: 1.93 m[IU]/L (ref 0.40–4.50)

## 2016-12-10 NOTE — Patient Instructions (Signed)

## 2016-12-10 NOTE — Progress Notes (Signed)
[facility]   Patrick Stone, M.D.    [name]. Steffanie Dunn, P.A.-C      Terri Piedra, P.A.-C  [facility]                [address]                [place], [place]. [address] Telephone [phone] Telefax [phone] Annual  Screening/Preventative Visit  & Comprehensive Evaluation & Examination     This very nice 51 y.o. MWM presents for a Screening/Preventative Visit & comprehensive evaluation and management of multiple medical co-morbidities.  Patient has been followed expectantly for labile HTN, Prediabetes, Hyperlipidemia and Vitamin D Deficiency.     Labile HTN predates circa 2006 and patient's BP has been controlled at home.  Today's BP is at goal - 122/72. Patient denies any cardiac symptoms as chest pain, palpitations, shortness of breath, dizziness or ankle swelling. Patient endorse exercising 7 days/week!     Patient's hyperlipidemia is controlled with diet and medications. Patient denies myalgias or other medication SE's. Last lipids were at goal.   Lab Results  Component Value Date   CHOL 178 10/05/2015   HDL 62 10/05/2015   LDLCALC 93 10/05/2015   TRIG 116 10/05/2015   CHOLHDL 2.9 10/05/2015      Patient has hx/o prediabetes  (A1c 5.9% in 2015) and patient denies reactive hypoglycemic symptoms, visual blurring, diabetic polys or paresthesias. Last A1c was  Lab Results  Component Value Date   HGBA1C 5.7 (H) 10/05/2015       Finally, patient has history of Vitamin D Deficiency ("27" in 2008)  and last vitamin D was still low (goal is 70-100): Lab Results  Component Value Date   VD25OH 45 10/05/2015   Current Outpatient Prescriptions on File Prior to Visit  Medication Sig  . Cholecalciferol (VITAMIN D PO) Take 10,000 Units by mouth daily.   Marland Kitchen loratadine (CLARITIN) 10 MG tablet Take 10 mg by mouth daily.  . Multiple Vitamins-Minerals (MULTIVITAMIN PO) Take by mouth daily.   No current  facility-administered medications on file prior to visit.    No Known Allergies Past Medical History:  Diagnosis Date  . Allergic rhinitis   . Elevated lipids   . Labile hypertension   . Vitamin D deficiency    Health Maintenance  Topic Date Due  . INFLUENZA VACCINE  05/29/2016  . COLONOSCOPY  06/24/2022  . TETANUS/TDAP  10/04/2025  . HIV Screening  Completed   Immunization History  Administered Date(s) Administered  . Influenza Split 09/20/2014  . Influenza, Seasonal, Injecte, Preservative Fre 10/05/2015  . Influenza-Unspecified 08/07/2013  . PPD Test 09/20/2014, 10/05/2015  . Tdap 10/05/2015   Past Surgical History:  Procedure Laterality Date  . ANTERIOR CRUCIATE LIGAMENT REPAIR Left 1986  . ORIF ANKLE FRACTURE Right 2007   Family History  Problem Relation Age of Onset  . Depression Mother   . Cancer Father     colon   Social History   Social History  . Marital status: Married    Spouse name: N/A  . Number of children: N/A  . Years of education: N/A   Occupational History  . Sales of loading dock equiptment x 24 years   Social History Main Topics  . Smoking status: Never Smoker  . Smokeless tobacco: Not on file  . Alcohol use 0.0 oz/week     Comment: Social  . Drug use: No  . Sexual activity: Not on  file    ROS Constitutional: Denies fever, chills, weight loss/gain, headaches, insomnia,  night sweats or change in appetite. Does c/o fatigue. Eyes: Denies redness, blurred vision, diplopia, discharge, itchy or watery eyes.  ENT: Denies discharge, congestion, post nasal drip, epistaxis, sore throat, earache, hearing loss, dental pain, Tinnitus, Vertigo, Sinus pain or snoring.  Cardio: Denies chest pain, palpitations, irregular heartbeat, syncope, dyspnea, diaphoresis, orthopnea, PND, claudication or edema Respiratory: denies cough, dyspnea, DOE, pleurisy, hoarseness, laryngitis or wheezing.  Gastrointestinal: Denies dysphagia, heartburn, reflux, water brash,  pain, cramps, nausea, vomiting, bloating, diarrhea, constipation, hematemesis, melena, hematochezia, jaundice or hemorrhoids Genitourinary: Denies dysuria, frequency, urgency, nocturia, hesitancy, discharge, hematuria or flank pain Musculoskeletal: Denies arthralgia, myalgia, stiffness, Jt. Swelling, pain, limp or strain/sprain. Denies Falls. Skin: Denies puritis, rash, hives, warts, acne, eczema or change in skin lesion Neuro: No weakness, tremor, incoordination, spasms, paresthesia or pain Psychiatric: Denies confusion, memory loss or sensory loss. Denies Depression. Endocrine: Denies change in weight, skin, hair change, nocturia, and paresthesia, diabetic polys, visual blurring or hyper / hypo glycemic episodes.  Heme/Lymph: No excessive bleeding, bruising or enlarged lymph nodes.  Physical Exam  BP 122/72   Pulse 72   Temp 97.8 F (36.6 C)   Resp 16   Ht 6\' 1"  (1.854 m)   Wt 195 lb 6.4 oz (88.6 kg)   BMI 25.78 kg/m   General Appearance: Well nourished, in no apparent distress.  Eyes: PERRLA, EOMs, conjunctiva no swelling or erythema, normal fundi and vessels. Sinuses: No frontal/maxillary tenderness ENT/Mouth: EACs patent / TMs  nl. Nares clear without erythema, swelling, mucoid exudates. Oral hygiene is good. No erythema, swelling, or exudate. Tongue normal, non-obstructing. Tonsils not swollen or erythematous. Hearing normal.  Neck: Supple, thyroid normal. No bruits, nodes or JVD. Respiratory: Respiratory effort normal.  BS equal and clear bilateral without rales, rhonci, wheezing or stridor. Cardio: Heart sounds are normal with regular rate and rhythm and no murmurs, rubs or gallops. Peripheral pulses are normal and equal bilaterally without edema. No aortic or femoral bruits. Chest: symmetric with normal excursions and percussion.  Abdomen: Soft, with Nl bowel sounds. Nontender, no guarding, rebound, hernias, masses, or organomegaly.  Lymphatics: Non tender without  lymphadenopathy.  Genitourinary: No hernias.Testes nl. DRE - prostate nl for age - smooth & firm w/o nodules. Musculoskeletal: Full ROM all peripheral extremities, joint stability, 5/5 strength, and normal gait. Skin: Warm and dry without rashes, lesions, cyanosis, clubbing or  ecchymosis.  Neuro: Cranial nerves intact, reflexes equal bilaterally. Normal muscle tone, no cerebellar symptoms. Sensation intact.  Pysch: Alert and oriented X 3 with normal affect, insight and judgment appropriate.   Assessment and Plan  1. Annual Preventative/Screening Exam   2. Labile hypertension  - Microalbumin / creatinine urine ratio - EKG 12-Lead - [place], RETROPERITNL ABD,  LTD - Urinalysis, Routine w reflex microscopic - CBC with Differential/Platelet - BASIC METABOLIC PANEL WITH GFR - TSH  3. Mixed hyperlipidemia  - EKG 12-Lead - [place], RETROPERITNL ABD,  LTD - Hepatic function panel - Lipid panel - TSH  4. Abnormal glucose  - EKG 12-Lead - [place], RETROPERITNL ABD,  LTD - Hemoglobin A1c - Insulin, random  5. Vitamin D deficiency  - VITAMIN D 25 Hydroxy   6. Screening for rectal cancer  - POC Hemoccult Bld/Stl (3-Cd Home Screen); Future  7. Prostate cancer screening  - PSA  8. Screening examination for pulmonary tuberculosis   9. Screening for ischemic heart disease  - EKG 12-Lead  10. Screening  for AAA (aortic abdominal aneurysm)  - US, RETROPERITNL ABD,  LTD  11. Fatigue, unspecified type  - Vitamin B12 - Iron and TIBC - CBC with Differential/Platelet  12. Medication management  - Urinalysis, Routine w reflex microscopic - CBC with Differential/Platelet - BASIC METABOLIC PANEL WITH GFR - Hepatic function panel - Magnesium - Lipid panel - VITAMIN D 25 Hydroxyl        Continue prudent diet as discussed, weight control, BP monitoring, regular exercise, and medications as discussed.  Discussed med effects and SE's. Routine screening labs and tests as requested with  regular follow-up as recommended. Over 40 minutes of exam, counseling, chart review and high complex critical decision making was performed

## 2016-12-11 LAB — INSULIN, RANDOM: INSULIN: 6.7 u[IU]/mL (ref 2.0–19.6)

## 2016-12-11 LAB — MICROALBUMIN / CREATININE URINE RATIO
Creatinine, Urine: 47 mg/dL (ref 20–370)
Microalb, Ur: <0.2 mg/dL

## 2016-12-11 LAB — URINALYSIS, ROUTINE W REFLEX MICROSCOPIC
Bilirubin Urine: NEGATIVE
Glucose, UA: NEGATIVE
Hgb urine dipstick: NEGATIVE
KETONES UR: NEGATIVE
LEUKOCYTES UA: NEGATIVE
NITRITE: NEGATIVE
PH: 6.5 (ref 5.0–8.0)
Protein, ur: NEGATIVE
SPECIFIC GRAVITY, URINE: 1.01 (ref 1.001–1.035)

## 2016-12-11 LAB — HEMOGLOBIN A1C
HEMOGLOBIN A1C: 5.2 % (ref <5.7)
MEAN PLASMA GLUCOSE: 103 mg/dL

## 2016-12-11 LAB — MAGNESIUM: MAGNESIUM: 1.9 mg/dL (ref 1.5–2.5)

## 2016-12-11 LAB — VITAMIN D 25 HYDROXY (VIT D DEFICIENCY, FRACTURES): VIT D 25 HYDROXY: 76 ng/mL (ref 30–100)

## 2017-09-20 DIAGNOSIS — H9311 Tinnitus, right ear: Secondary | ICD-10-CM | POA: Diagnosis not present

## 2017-10-30 DIAGNOSIS — H9193 Unspecified hearing loss, bilateral: Secondary | ICD-10-CM | POA: Diagnosis not present

## 2017-10-30 DIAGNOSIS — H903 Sensorineural hearing loss, bilateral: Secondary | ICD-10-CM | POA: Diagnosis not present

## 2017-10-30 DIAGNOSIS — H9311 Tinnitus, right ear: Secondary | ICD-10-CM | POA: Diagnosis not present

## 2017-11-19 NOTE — Progress Notes (Signed)
Assessment and Plan:  Casimiro NeedleMichael was seen today for uri.  Diagnoses and all orders for this visit:  Acute nasopharyngitis (common cold) - Discussed the importance of avoiding unnecessary antibiotic therapy. Suggested symptomatic OTC remedies. Nasal saline spray for congestion. Nasal steroids, allergy pill, oral steroids Follow up as needed.  -     promethazine-dextromethorphan (PROMETHAZINE-DM) 6.25-15 MG/5ML syrup; Take 5 mLs by mouth 4 (four) times daily as needed for cough. -     predniSONE (DELTASONE) 20 MG tablet; 2 tablets daily for 3 days, 1 tablet daily for 4 days.  Further disposition pending results of labs. Discussed med's effects and SE's.   Over 15 minutes of exam, counseling, chart review, and critical decision making was performed.   Future Appointments  Date Time Provider Department Center  01/06/2018  9:00 AM Lucky CowboyMcKeown, William, MD [facility] None    ------------------------------------------------------------------------------------------------------------------   HPI BP 118/76   Pulse (!) 58   Temp 97.9 F (36.6 C)   Ht 6\' 1"  (1.854 m)   Wt 196 lb (88.9 kg)   SpO2 98%   BMI 25.86 kg/m   52 y.o.male presents for cough and congestion x 1 week. Denies fever/chills, mildly productive of clear mucus. Denies chest pain, dyspnea, headaches, dizziness, barthralgias/myalgias, rashes.    Has hx of allergic rhinitis - taking claritin daily. He has had flu vaccine this year. No family members ill or other sick contacts.   Past Medical History:  Diagnosis Date  . Allergic rhinitis   . Elevated lipids   . Labile hypertension   . Vitamin D deficiency      No Known Allergies  Current Outpatient Medications on File Prior to Visit  Medication Sig  . aspirin EC 81 MG tablet Take 81 mg by mouth daily.  . Cholecalciferol (VITAMIN D PO) Take 10,000 Units by mouth daily.   Marland Kitchen. loratadine (CLARITIN) 10 MG tablet Take 10 mg by mouth daily.  . Multiple Vitamins-Minerals  (MULTIVITAMIN PO) Take by mouth daily.   No current facility-administered medications on file prior to visit.     ROS: Review of Systems  Constitutional: Negative for chills, diaphoresis, fever and malaise/fatigue.  HENT: Positive for congestion. Negative for ear discharge, ear pain, hearing loss, sinus pain, sore throat and tinnitus.   Eyes: Negative for blurred vision, pain, discharge and redness.  Respiratory: Positive for cough. Negative for hemoptysis, sputum production, shortness of breath, wheezing and stridor.   Cardiovascular: Negative for chest pain, palpitations and orthopnea.  Gastrointestinal: Negative for abdominal pain, diarrhea, nausea and vomiting.  Genitourinary: Negative.   Musculoskeletal: Negative for joint pain and myalgias.  Skin: Negative for rash.  Neurological: Negative for dizziness, sensory change, weakness and headaches.  Endo/Heme/Allergies: Negative for environmental allergies.  Psychiatric/Behavioral: Negative.   All other systems reviewed and are negative.   Physical Exam:  BP 118/76   Pulse (!) 58   Temp 97.9 F (36.6 C)   Ht 6\' 1"  (1.854 m)   Wt 196 lb (88.9 kg)   SpO2 98%   BMI 25.86 kg/m   General Appearance: Well nourished, in no apparent distress. Eyes: PERRLA, EOMs, conjunctiva no swelling or erythema Sinuses: No Frontal/maxillary tenderness ENT/Mouth: Ext aud canals clear, TMs without erythema, bulging. No erythema, swelling, or exudate on post pharynx.  Tonsils not swollen or erythematous. Hearing normal.  Neck: Supple, thyroid normal.  Respiratory: Respiratory effort normal, BS equal bilaterally without rales, rhonchi, wheezing or stridor.  Cardio: RRR with no MRGs. Brisk peripheral pulses without edema.  Abdomen: Soft, + BS.  Non tender, no guarding, rebound, hernias, masses. Lymphatics: Non tender without lymphadenopathy.  Musculoskeletal: Full ROM, 5/5 strength, normal gait.  Skin: Warm, dry without rashes, lesions, ecchymosis.   Neuro: Cranial nerves intact. Normal muscle tone, no cerebellar symptoms.  Psych: Awake and oriented X 3, normal affect, Insight and Judgment appropriate.     Dan Maker, NP 2:41 PM [facility]

## 2017-11-20 ENCOUNTER — Encounter: Payer: Self-pay | Admitting: Adult Health

## 2017-11-20 ENCOUNTER — Ambulatory Visit: Payer: BLUE CROSS/BLUE SHIELD | Admitting: Adult Health

## 2017-11-20 VITALS — BP 118/76 | HR 58 | Temp 97.9°F | Ht 73.0 in | Wt 196.0 lb

## 2017-11-20 DIAGNOSIS — J00 Acute nasopharyngitis [common cold]: Secondary | ICD-10-CM

## 2017-11-20 MED ORDER — PROMETHAZINE-DM 6.25-15 MG/5ML PO SYRP: 5.0000 mL | ORAL_SOLUTION | Freq: Four times a day (QID) | ORAL | 1 refills | Status: DC | PRN

## 2017-11-20 MED ORDER — PREDNISONE 20 MG PO TABS
ORAL_TABLET | ORAL | 0 refills | Status: DC
Start: 2017-11-20 — End: 2018-01-05

## 2017-11-20 NOTE — Patient Instructions (Signed)

## 2017-11-21 ENCOUNTER — Other Ambulatory Visit: Payer: Self-pay | Admitting: Adult Health

## 2017-11-21 ENCOUNTER — Encounter: Payer: Self-pay | Admitting: Adult Health

## 2017-11-21 DIAGNOSIS — R059 Cough, unspecified: Secondary | ICD-10-CM

## 2017-11-21 DIAGNOSIS — R05 Cough: Secondary | ICD-10-CM

## 2017-11-21 MED ORDER — PROMETHAZINE-CODEINE 6.25-10 MG/5ML PO SYRP: 5.0000 mL | ORAL_SOLUTION | Freq: Four times a day (QID) | ORAL | 0 refills | Status: DC | PRN

## 2017-11-27 DIAGNOSIS — Z800 Family history of malignant neoplasm of digestive organs: Secondary | ICD-10-CM | POA: Diagnosis not present

## 2017-11-27 DIAGNOSIS — Z01818 Encounter for other preprocedural examination: Secondary | ICD-10-CM | POA: Diagnosis not present

## 2018-01-05 ENCOUNTER — Encounter: Payer: Self-pay | Admitting: Internal Medicine

## 2018-01-05 NOTE — Progress Notes (Signed)
Patrick Stone   Lucky CowboyWilliam Hays Stone, M.D.     Patrick CarrelAmanda R. Steffanie Stone, P.A.-C Patrick GaudierAshley Corbett, DNP [facility]                [address]                [place], [place] [id number] Telephone [phone] Telefax [phone] Annual  Screening/Preventative Visit  & Comprehensive Evaluation & Examination     This very nice 52 y.o. MWM presents for a Screening/Preventative Visit & comprehensive evaluation and management of multiple medical co-morbidities.  Patient has been followed expectantly for labile  HTN, HLD, Prediabetes and Vitamin D Deficiency.     HTN predates since 2006. Patient's BP has been controlled at home.  Today's BP is at goal - 114/76. Patient denies any cardiac symptoms as chest pain, palpitations, shortness of breath, dizziness or ankle swelling. Patient exercises most days.     Patient's hyperlipidemia is controlled with diet.  Last lipids were at goal: Lab Results  Component Value Date   CHOL 177 12/10/2016   HDL 62 12/10/2016   LDLCALC 95 12/10/2016   TRIG 100 12/10/2016   CHOLHDL 2.9 12/10/2016      Patient has prediabetes (A1c 5.9%/2015) and patient denies reactive hypoglycemic symptoms, visual blurring, diabetic polys or paresthesias. Last A1c was Normal & at goal: Lab Results  Component Value Date   HGBA1C 5.2 12/10/2016       Finally, patient has history of Vitamin D Deficiency ("27"/2008) and last vitamin D was at goal: Lab Results  Component Value Date   VD25OH 76 12/10/2016   Current Outpatient Medications on File Prior to Visit  Medication Sig  . aspirin EC 81 MG tablet Take 81 mg by mouth daily.  . Cholecalciferol (VITAMIN D PO) Take 10,000 Units by mouth daily.   Marland Kitchen. loratadine (CLARITIN) 10 MG tablet Take 10 mg by mouth daily.  . Multiple Vitamins-Minerals (MULTIVITAMIN PO) Take by mouth daily.   No current facility-administered medications on file prior to visit.    No Known Allergies    Past Medical History:  Diagnosis Date  . Allergic rhinitis   . Elevated lipids   . Labile hypertension   . Vitamin D deficiency    Health Maintenance  Topic Date Due  . COLONOSCOPY  06/24/2022  . TETANUS/TDAP  10/04/2025  . INFLUENZA VACCINE  Completed  . HIV Screening  Completed   Immunization History  Administered Date(s) Administered  . Influenza Split 09/20/2014  . Influenza, Seasonal, Injecte, Preservative Fre 10/05/2015  . Influenza-Unspecified 08/07/2013, 08/03/2017  . PPD Test 09/20/2014, 10/05/2015, 12/10/2016  . Tdap 10/05/2015   Last Colon - 05/2012 - Dr Danise EdgeMartin Johnson  Past Surgical History:  Procedure Laterality Date  . ANTERIOR CRUCIATE LIGAMENT REPAIR Left 1986  . ORIF ANKLE FRACTURE Right 2007   Family History  Problem Relation Age of Onset  . Depression Mother   . Cancer Father        colon   Social History   Socioeconomic History  . Marital status: Married    Spouse name: Not on file  . Number of children: Not on file  Occupational History  . Not on file  Tobacco Use  . Smoking status: Never Smoker  . Smokeless tobacco: Never Used  Substance and Sexual Activity  . Alcohol use: Yes    Alcohol/week: 0.0 oz    Comment: Social  . Drug use: No  . Sexual activity: Not  on file    ROS Constitutional: Denies fever, chills, weight loss/gain, headaches, insomnia,  night sweats or change in appetite. Does c/o fatigue. Eyes: Denies redness, blurred vision, diplopia, discharge, itchy or watery eyes.  ENT: Denies discharge, congestion, post nasal drip, epistaxis, sore throat, earache, hearing loss, dental pain, Tinnitus, Vertigo, Sinus pain or snoring.  Cardio: Denies chest pain, palpitations, irregular heartbeat, syncope, dyspnea, diaphoresis, orthopnea, PND, claudication or edema Respiratory: denies cough, dyspnea, DOE, pleurisy, hoarseness, laryngitis or wheezing.  Gastrointestinal: Denies dysphagia, heartburn, reflux, water brash, pain, cramps,  nausea, vomiting, bloating, diarrhea, constipation, hematemesis, melena, hematochezia, jaundice or hemorrhoids Genitourinary: Denies dysuria, frequency, urgency, nocturia, hesitancy, discharge, hematuria or flank pain Musculoskeletal: Denies arthralgia, myalgia, stiffness, Jt. Swelling, pain, limp or strain/sprain. Denies Falls. Skin: Denies puritis, rash, hives, warts, acne, eczema or change in skin lesion Neuro: No weakness, tremor, incoordination, spasms, paresthesia or pain Psychiatric: Denies confusion, memory loss or sensory loss. Denies Depression. Endocrine: Denies change in weight, skin, hair change, nocturia, and paresthesia, diabetic polys, visual blurring or hyper / hypo glycemic episodes.  Heme/Lymph: No excessive bleeding, bruising or enlarged lymph nodes.  Physical Exam  BP 114/76   Pulse 76   Temp (!) 97.5 F (36.4 C)   Resp 16   Ht 6\' 1"  (1.854 m)   Wt 194 lb 3.2 oz (88.1 kg)   BMI 25.62 kg/m   General Appearance: Well nourished and well groomed and in no apparent distress.  Eyes: PERRLA, EOMs, conjunctiva no swelling or erythema, normal fundi and vessels. Sinuses: No frontal/maxillary tenderness ENT/Mouth: EACs patent / TMs  nl. Nares clear without erythema, swelling, mucoid exudates. Oral hygiene is good. No erythema, swelling, or exudate. Tongue normal, non-obstructing. Tonsils not swollen or erythematous. Hearing normal.  Neck: Supple, thyroid not palpable. No bruits, nodes or JVD. Respiratory: Respiratory effort normal.  BS equal and clear bilateral without rales, rhonci, wheezing or stridor. Cardio: Heart sounds are normal with regular rate and rhythm and no murmurs, rubs or gallops. Peripheral pulses are normal and equal bilaterally without edema. No aortic or femoral bruits. Chest: symmetric with normal excursions and percussion.  Abdomen: Soft, with Nl bowel sounds. Nontender, no guarding, rebound, hernias, masses, or organomegaly.  Lymphatics: Non tender  without lymphadenopathy.  Genitourinary: No hernias.Testes nl. DRE - deferred to Colonoscope scheduled 2 days on 01/08/2018 Musculoskeletal: Full ROM all peripheral extremities, joint stability, 5/5 strength, and normal gait. Skin: Warm and dry without rashes, lesions, cyanosis, clubbing or  ecchymosis.  Neuro: Cranial nerves intact, reflexes equal bilaterally. Normal muscle tone, no cerebellar symptoms. Sensation intact.  Pysch: Alert and oriented X 3 with normal affect, insight and judgment appropriate.   Assessment and Plan  1. Annual Preventative/Screening Exam   2. Labile hypertension  - EKG 12-Lead - [place], RETROPERITNL ABD,  LTD - Urinalysis, Routine w reflex microscopic - Microalbumin / creatinine urine ratio - CBC with Differential/Platelet - BASIC METABOLIC PANEL WITH GFR - Magnesium - TSH  3. Mixed hyperlipidemia  - EKG 12-Lead - [place], RETROPERITNL ABD,  LTD - Hepatic function panel - Lipid panel - TSH  4. Abnormal glucose  - EKG 12-Lead - [place], RETROPERITNL ABD,  LTD - Hemoglobin A1c - Insulin, random  5. Vitamin D deficiency  - VITAMIN D 25 Hydroxy  6. Prediabetes  - EKG 12-Lead - [place], RETROPERITNL ABD,  LTD - Hemoglobin A1c - Insulin, random  7. Screening for colorectal cancer  8. Prostate cancer screening  - PSA  9. Screening examination for pulmonary  tuberculosis  - PPD  10. Screening for ischemic heart disease  - EKG 12-Lead  11. Screening for AAA (aortic abdominal aneurysm)  - [place], RETROPERITNL ABD,  LTD  12. Fatigue, unspecified type  - Iron,Total/Total Iron Binding Cap - Vitamin B12 - Testosterone - CBC with Differential/Platelet  13. Medication management  - Urinalysis, Routine w reflex microscopic - Microalbumin / creatinine urine ratio - CBC with Differential/Platelet - BASIC METABOLIC PANEL WITH GFR - Hepatic function panel - Magnesium - Lipid panel - TSH - Hemoglobin A1c - Insulin, random - VITAMIN D 25 Hydroxy          Patient was counseled in prudent diet, weight control to achieve/maintain BMI less than 25, BP monitoring, regular exercise and medications as discussed.  Discussed med effects and SE's. Routine screening labs and tests as requested with regular follow-up as recommended. Over 40 minutes of exam, counseling, chart review and high complex critical decision making was performed

## 2018-01-05 NOTE — Patient Instructions (Signed)

## 2018-01-06 ENCOUNTER — Encounter: Payer: Self-pay | Admitting: Internal Medicine

## 2018-01-06 ENCOUNTER — Ambulatory Visit: Payer: BLUE CROSS/BLUE SHIELD | Admitting: Internal Medicine

## 2018-01-06 VITALS — BP 114/76 | HR 76 | Temp 97.5°F | Resp 16 | Ht 73.0 in | Wt 194.2 lb

## 2018-01-06 DIAGNOSIS — Z0001 Encounter for general adult medical examination with abnormal findings: Secondary | ICD-10-CM

## 2018-01-06 DIAGNOSIS — Z111 Encounter for screening for respiratory tuberculosis: Secondary | ICD-10-CM | POA: Diagnosis not present

## 2018-01-06 DIAGNOSIS — Z1212 Encounter for screening for malignant neoplasm of rectum: Secondary | ICD-10-CM

## 2018-01-06 DIAGNOSIS — Z125 Encounter for screening for malignant neoplasm of prostate: Secondary | ICD-10-CM | POA: Diagnosis not present

## 2018-01-06 DIAGNOSIS — R7309 Other abnormal glucose: Secondary | ICD-10-CM

## 2018-01-06 DIAGNOSIS — E559 Vitamin D deficiency, unspecified: Secondary | ICD-10-CM

## 2018-01-06 DIAGNOSIS — R5383 Other fatigue: Secondary | ICD-10-CM

## 2018-01-06 DIAGNOSIS — Z1211 Encounter for screening for malignant neoplasm of colon: Secondary | ICD-10-CM

## 2018-01-06 DIAGNOSIS — R0989 Other specified symptoms and signs involving the circulatory and respiratory systems: Secondary | ICD-10-CM

## 2018-01-06 DIAGNOSIS — Z0000 Encounter for general adult medical examination without abnormal findings: Secondary | ICD-10-CM | POA: Diagnosis not present

## 2018-01-06 DIAGNOSIS — Z79899 Other long term (current) drug therapy: Secondary | ICD-10-CM

## 2018-01-06 DIAGNOSIS — Z136 Encounter for screening for cardiovascular disorders: Secondary | ICD-10-CM

## 2018-01-06 DIAGNOSIS — R7303 Prediabetes: Secondary | ICD-10-CM

## 2018-01-06 DIAGNOSIS — I10 Essential (primary) hypertension: Secondary | ICD-10-CM

## 2018-01-06 DIAGNOSIS — E782 Mixed hyperlipidemia: Secondary | ICD-10-CM

## 2018-01-07 LAB — CBC WITH DIFFERENTIAL/PLATELET
Basophils Absolute: 38 cells/uL (ref 0–200)
Basophils Relative: 0.7 %
EOS ABS: 140 {cells}/uL (ref 15–500)
Eosinophils Relative: 2.6 %
HCT: 41.9 % (ref 38.5–50.0)
Hemoglobin: 14.5 g/dL (ref 13.2–17.1)
Lymphs Abs: 972 cells/uL (ref 850–3900)
MCH: 31.6 pg (ref 27.0–33.0)
MCHC: 34.6 g/dL (ref 32.0–36.0)
MCV: 91.3 fL (ref 80.0–100.0)
MPV: 11.1 fL (ref 7.5–12.5)
Monocytes Relative: 10.7 %
Neutro Abs: 3672 cells/uL (ref 1500–7800)
Neutrophils Relative %: 68 %
PLATELETS: 232 10*3/uL (ref 140–400)
RBC: 4.59 10*6/uL (ref 4.20–5.80)
RDW: 11.5 % (ref 11.0–15.0)
TOTAL LYMPHOCYTE: 18 %
WBC: 5.4 10*3/uL (ref 3.8–10.8)
WBCMIX: 578 {cells}/uL (ref 200–950)

## 2018-01-07 LAB — HEPATIC FUNCTION PANEL
AG Ratio: 1.7 (calc) (ref 1.0–2.5)
ALKALINE PHOSPHATASE (APISO): 45 U/L (ref 40–115)
ALT: 12 U/L (ref 9–46)
AST: 17 U/L (ref 10–35)
Albumin: 4.3 g/dL (ref 3.6–5.1)
BILIRUBIN DIRECT: 0.2 mg/dL (ref 0.0–0.2)
BILIRUBIN INDIRECT: 0.6 mg/dL (ref 0.2–1.2)
GLOBULIN: 2.6 g/dL (ref 1.9–3.7)
Total Bilirubin: 0.8 mg/dL (ref 0.2–1.2)
Total Protein: 6.9 g/dL (ref 6.1–8.1)

## 2018-01-07 LAB — LIPID PANEL
Cholesterol: 196 mg/dL (ref <200)
HDL: 52 mg/dL (ref >40)
LDL CHOLESTEROL (CALC): 119 mg/dL — AB
Non-HDL Cholesterol (Calc): 144 mg/dL (calc) — ABNORMAL HIGH (ref <130)
Total CHOL/HDL Ratio: 3.8 (calc) (ref <5.0)
Triglycerides: 131 mg/dL (ref <150)

## 2018-01-07 LAB — BASIC METABOLIC PANEL WITH GFR
BUN: 18 mg/dL (ref 7–25)
CHLORIDE: 102 mmol/L (ref 98–110)
CO2: 31 mmol/L (ref 20–32)
Calcium: 9.6 mg/dL (ref 8.6–10.3)
Creat: 1.07 mg/dL (ref 0.70–1.33)
GFR, EST AFRICAN AMERICAN: 92 mL/min/{1.73_m2} (ref >=60)
GFR, Est Non African American: 79 mL/min/{1.73_m2} (ref >=60)
Glucose, Bld: 68 mg/dL (ref 65–99)
POTASSIUM: 4.5 mmol/L (ref 3.5–5.3)
SODIUM: 139 mmol/L (ref 135–146)

## 2018-01-07 LAB — URINALYSIS, ROUTINE W REFLEX MICROSCOPIC
BILIRUBIN URINE: NEGATIVE
Glucose, UA: NEGATIVE
Hgb urine dipstick: NEGATIVE
Ketones, ur: NEGATIVE
Leukocytes, UA: NEGATIVE
NITRITE: NEGATIVE
Protein, ur: NEGATIVE
Specific Gravity, Urine: 1.022 (ref 1.001–1.03)
pH: 6.5 (ref 5.0–8.0)

## 2018-01-07 LAB — HEMOGLOBIN A1C
Hgb A1c MFr Bld: 5.5 % of total Hgb (ref <5.7)
Mean Plasma Glucose: 111 (calc)
eAG (mmol/L): 6.2 (calc)

## 2018-01-07 LAB — TESTOSTERONE: Testosterone: 594 ng/dL (ref 250–827)

## 2018-01-07 LAB — VITAMIN D 25 HYDROXY (VIT D DEFICIENCY, FRACTURES): VIT D 25 HYDROXY: 96 ng/mL (ref 30–100)

## 2018-01-07 LAB — INSULIN, RANDOM: Insulin: 6.1 u[IU]/mL (ref 2.0–19.6)

## 2018-01-07 LAB — VITAMIN B12: VITAMIN B 12: 668 pg/mL (ref 200–1100)

## 2018-01-07 LAB — TSH: TSH: 1.29 m[IU]/L (ref 0.40–4.50)

## 2018-01-07 LAB — MAGNESIUM: MAGNESIUM: 1.9 mg/dL (ref 1.5–2.5)

## 2018-01-07 LAB — MICROALBUMIN / CREATININE URINE RATIO
CREATININE, URINE: 149 mg/dL (ref 20–320)
MICROALB UR: 0.3 mg/dL
MICROALB/CREAT RATIO: 2 ug/mg{creat} (ref <30)

## 2018-01-07 LAB — IRON, TOTAL/TOTAL IRON BINDING CAP
%SAT: 33 % (calc) (ref 15–60)
IRON: 107 ug/dL (ref 50–180)
TIBC: 329 mcg/dL (calc) (ref 250–425)

## 2018-01-07 LAB — PSA: PSA: 0.7 ng/mL (ref <=4.0)

## 2018-01-08 DIAGNOSIS — Z1211 Encounter for screening for malignant neoplasm of colon: Secondary | ICD-10-CM | POA: Diagnosis not present

## 2018-01-08 DIAGNOSIS — D126 Benign neoplasm of colon, unspecified: Secondary | ICD-10-CM | POA: Diagnosis not present

## 2018-01-08 DIAGNOSIS — K5730 Diverticulosis of large intestine without perforation or abscess without bleeding: Secondary | ICD-10-CM | POA: Diagnosis not present

## 2018-01-08 DIAGNOSIS — Z800 Family history of malignant neoplasm of digestive organs: Secondary | ICD-10-CM | POA: Diagnosis not present

## 2018-01-08 LAB — HM COLONOSCOPY

## 2018-01-14 DIAGNOSIS — Z1211 Encounter for screening for malignant neoplasm of colon: Secondary | ICD-10-CM | POA: Diagnosis not present

## 2018-01-14 DIAGNOSIS — D126 Benign neoplasm of colon, unspecified: Secondary | ICD-10-CM | POA: Diagnosis not present

## 2018-01-21 ENCOUNTER — Encounter: Payer: Self-pay | Admitting: *Deleted

## 2018-01-22 ENCOUNTER — Encounter: Payer: Self-pay | Admitting: *Deleted

## 2018-02-04 DIAGNOSIS — M25562 Pain in left knee: Secondary | ICD-10-CM | POA: Diagnosis not present

## 2018-02-04 DIAGNOSIS — M1732 Unilateral post-traumatic osteoarthritis, left knee: Secondary | ICD-10-CM | POA: Diagnosis not present

## 2018-06-17 DIAGNOSIS — H5213 Myopia, bilateral: Secondary | ICD-10-CM | POA: Diagnosis not present

## 2018-06-17 DIAGNOSIS — H524 Presbyopia: Secondary | ICD-10-CM | POA: Diagnosis not present

## 2019-01-21 ENCOUNTER — Ambulatory Visit: Payer: BLUE CROSS/BLUE SHIELD | Admitting: Internal Medicine

## 2019-01-21 ENCOUNTER — Other Ambulatory Visit: Payer: Self-pay

## 2019-01-21 ENCOUNTER — Encounter: Payer: Self-pay | Admitting: Internal Medicine

## 2019-01-21 VITALS — BP 116/70 | HR 80 | Temp 97.0°F | Resp 16 | Ht 72.0 in | Wt 187.0 lb

## 2019-01-21 DIAGNOSIS — Z1212 Encounter for screening for malignant neoplasm of rectum: Secondary | ICD-10-CM

## 2019-01-21 DIAGNOSIS — R350 Frequency of micturition: Secondary | ICD-10-CM

## 2019-01-21 DIAGNOSIS — R0989 Other specified symptoms and signs involving the circulatory and respiratory systems: Secondary | ICD-10-CM | POA: Diagnosis not present

## 2019-01-21 DIAGNOSIS — Z136 Encounter for screening for cardiovascular disorders: Secondary | ICD-10-CM

## 2019-01-21 DIAGNOSIS — E559 Vitamin D deficiency, unspecified: Secondary | ICD-10-CM | POA: Diagnosis not present

## 2019-01-21 DIAGNOSIS — R7303 Prediabetes: Secondary | ICD-10-CM

## 2019-01-21 DIAGNOSIS — Z1389 Encounter for screening for other disorder: Secondary | ICD-10-CM

## 2019-01-21 DIAGNOSIS — Z130 Encounter for screening for diseases of the blood and blood-forming organs and certain disorders involving the immune mechanism: Secondary | ICD-10-CM

## 2019-01-21 DIAGNOSIS — Z13220 Encounter for screening for lipoid disorders: Secondary | ICD-10-CM | POA: Diagnosis not present

## 2019-01-21 DIAGNOSIS — Z0000 Encounter for general adult medical examination without abnormal findings: Secondary | ICD-10-CM | POA: Diagnosis not present

## 2019-01-21 DIAGNOSIS — N401 Enlarged prostate with lower urinary tract symptoms: Secondary | ICD-10-CM

## 2019-01-21 DIAGNOSIS — E782 Mixed hyperlipidemia: Secondary | ICD-10-CM

## 2019-01-21 DIAGNOSIS — Z131 Encounter for screening for diabetes mellitus: Secondary | ICD-10-CM | POA: Diagnosis not present

## 2019-01-21 DIAGNOSIS — Z79899 Other long term (current) drug therapy: Secondary | ICD-10-CM | POA: Diagnosis not present

## 2019-01-21 DIAGNOSIS — R7309 Other abnormal glucose: Secondary | ICD-10-CM

## 2019-01-21 DIAGNOSIS — Z0001 Encounter for general adult medical examination with abnormal findings: Secondary | ICD-10-CM

## 2019-01-21 DIAGNOSIS — Z125 Encounter for screening for malignant neoplasm of prostate: Secondary | ICD-10-CM

## 2019-01-21 DIAGNOSIS — Z1329 Encounter for screening for other suspected endocrine disorder: Secondary | ICD-10-CM | POA: Diagnosis not present

## 2019-01-21 DIAGNOSIS — R5383 Other fatigue: Secondary | ICD-10-CM

## 2019-01-21 DIAGNOSIS — Z111 Encounter for screening for respiratory tuberculosis: Secondary | ICD-10-CM

## 2019-01-21 DIAGNOSIS — Z1211 Encounter for screening for malignant neoplasm of colon: Secondary | ICD-10-CM

## 2019-01-21 NOTE — Progress Notes (Signed)
[facility]   [name], M.D.     [name]. Steffanie Dunn, P.A.-C [name], DNP [facility]                [address]                [place], [place]. [address] Telephone [phone] Telefax [phone] Annual  Screening/Preventative Visit  & Comprehensive Evaluation & Examination     This very nice 53 y.o. mwm presents for a Screening /Preventative Visit & comprehensive evaluation and management of multiple medical co-morbidities.  Patient has been followed expectantly for hx/o labile HTN, HLD, Prediabetes and Vitamin D Deficiency.     Labile HTN predates circa 2006. Patient's BP has been controlled at home.  Today's BP is at goal - 116/70. Patient denies any cardiac symptoms as chest pain, palpitations, shortness of breath, dizziness or ankle swelling.     Patient's lipids are not controlled with diet. Last lipids were not at goal: Lab Results  Component Value Date   CHOL 196 01/06/2018   HDL 52 01/06/2018   LDLCALC 119 (H) 01/06/2018   TRIG 131 01/06/2018   CHOLHDL 3.8 01/06/2018      Patient has hx/o prediabetes (A1c 5.9% / 2015)  and patient denies reactive hypoglycemic symptoms, visual blurring, diabetic polys or paresthesias. Last A1c was Normal & at goal: Lab Results  Component Value Date   HGBA1C 5.5 01/06/2018       Finally, patient has history of Vitamin D Deficiency ("27" / 2008) and last vitamin D was at goal: Lab Results  Component Value Date   VD25OH 96 01/06/2018   Current Outpatient Medications on File Prior to Visit  Medication Sig  . aspirin EC 81 MG  Take  daily.  Marland Kitchen VITAMIN D 10,000 Units Takedaily.   Marland Kitchen loratadine  10 MG Take  daily.  . MultiVit-Minerals  Take  daily.   No Known Allergies   Past Medical History:  Diagnosis Date  . Allergic rhinitis   . Elevated lipids   . Labile hypertension   . Vitamin D deficiency    Health Maintenance  Topic Date Due  . COLONOSCOPY   01/09/2023  . TETANUS/TDAP  10/04/2025  . INFLUENZA VACCINE  Completed  . HIV Screening  Completed   Immunization History  Administered Date(s) Administered  . Influenza Split 09/20/2014  . Influenza, Seasonal, Injecte, Preservative Fre 10/05/2015  . Influenza-Unspecified 08/07/2013, 08/03/2017, 09/12/2018  . PPD Test 09/20/2014, 10/05/2015, 12/10/2016, 01/06/2018  . Tdap 10/05/2015  . Zoster Recombinat (Shingrix) 09/12/2018, 11/21/2018   Colon - 05/2012 - Dr Danise Edge -> Last Colon - 01/08/2018 - Dr Kerin Salen - recc 5 yr f/u due Mar 2024  Past Surgical History:  Procedure Laterality Date  . ANTERIOR CRUCIATE LIGAMENT REPAIR Left 1986  . ORIF ANKLE FRACTURE Right 2007   Family History  Problem Relation Age of Onset  . Depression Mother   . Cancer Father        colon   Social History   Socioeconomic History  . Marital status: Married    Spouse name: Lynden Ang   . 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 Smoking status: Never Smoker  . Smokeless tobacco: Never Used  Substance and Sexual Activity  . Alcohol use: Yes    Alcohol/week: 0.0 standard drinks    Comment: Social  .  Drug use: No  . Sexual activity: Not on file    ROS Constitutional: Denies fever, chills, weight loss/gain, headaches, insomnia,  night sweats or change in appetite. Does c/o fatigue. Eyes: Denies redness, blurred vision, diplopia, discharge, itchy or watery eyes.  ENT: Denies discharge, congestion, post nasal drip, epistaxis, sore throat, earache, hearing loss, dental pain, Tinnitus, Vertigo, Sinus pain or snoring.  Cardio: Denies chest pain, palpitations, irregular heartbeat, syncope, dyspnea, diaphoresis, orthopnea, PND, claudication or edema Respiratory: denies cough, dyspnea, DOE, pleurisy, hoarseness, laryngitis or wheezing.  Gastrointestinal: Denies dysphagia, heartburn, reflux, water brash, pain, cramps, nausea,  vomiting, bloating, diarrhea, constipation, hematemesis, melena, hematochezia, jaundice or hemorrhoids Genitourinary: Denies dysuria, frequency, urgency, nocturia, hesitancy, discharge, hematuria or flank pain Musculoskeletal: Denies arthralgia, myalgia, stiffness, Jt. Swelling, pain, limp or strain/sprain. Denies Falls. Skin: Denies puritis, rash, hives, warts, acne, eczema or change in skin lesion Neuro: No weakness, tremor, incoordination, spasms, paresthesia or pain Psychiatric: Denies confusion, memory loss or sensory loss. Denies Depression. Endocrine: Denies change in weight, skin, hair change, nocturia, and paresthesia, diabetic polys, visual blurring or hyper / hypo glycemic episodes.  Heme/Lymph: No excessive bleeding, bruising or enlarged lymph nodes.  Physical Exam  BP 116/70   Pulse 80   Temp (!) 97 F (36.1 C)   Resp 16   Ht 6' (1.829 m)   Wt 187 lb (84.8 kg)   BMI 25.36 kg/m   General Appearance: Well nourished and well groomed and in no apparent distress.  Eyes: PERRLA, EOMs, conjunctiva no swelling or erythema, normal fundi and vessels. Sinuses: No frontal/maxillary tenderness ENT/Mouth: EACs patent / TMs  nl. Nares clear without erythema, swelling, mucoid exudates. Oral hygiene is good. No erythema, swelling, or exudate. Tongue normal, non-obstructing. Tonsils not swollen or erythematous. Hearing normal.  Neck: Supple, thyroid not palpable. No bruits, nodes or JVD. Respiratory: Respiratory effort normal.  BS equal and clear bilateral without rales, rhonci, wheezing or stridor. Cardio: Heart sounds are normal with regular rate and rhythm and no murmurs, rubs or gallops. Peripheral pulses are normal and equal bilaterally without edema. No aortic or femoral bruits. Chest: symmetric with normal excursions and percussion.  Abdomen: Soft, with Nl bowel sounds. Nontender, no guarding, rebound, hernias, masses, or organomegaly.  Lymphatics: Non tender without lymphadenopathy.   Musculoskeletal: Full ROM all peripheral extremities, joint stability, 5/5 strength, and normal gait. Skin: Warm and dry without rashes, lesions, cyanosis, clubbing or  ecchymosis.  Neuro: Cranial nerves intact, reflexes equal bilaterally. Normal muscle tone, no cerebellar symptoms. Sensation intact.  Pysch: Alert and oriented X 3 with normal affect, insight and judgment appropriate.   Assessment and Plan  1. Annual Preventative/Screening Exam   2. Labile hypertension  - EKG 12-Lead - US, RETROPERITNL ABD,  LTD - Urinalysis, Routine w reflex microscopic - Microalbumin / creatinine urine ratio - CBC with Differential/Platelet - COMPLETE METABOLIC PANEL WITH GFR - Magnesium - TSH  3. Hyperlipidemia, mixed  - Lipid panel - TSH  4. Abnormal glucose  - Hemoglobin A1c - Insulin, random  5. Vitamin D deficiency  - VITAMIN D 25 Hydroxyl  6. Prediabetes  - Hemoglobin A1c - Insulin, random  7. Screening for colorectal cancer  - POC Hemoccult Bld/Stl (3-Cd Home Screen); Future  8. Screening examination for pulmonary tuberculosis  - TB Skin Test  9. Screening for ischemic heart disease   10. Screening for AAA (aortic abdominal aneurysm)   11. Fatigue, unspecified type  - Iron,Total/Total Iron Binding Cap - Vitamin B12 - Testosterone -  CBC with Differential/Platelet  12. Medication management  - Urinalysis, Routine w reflex microscopic - Microalbumin / creatinine urine ratio - CBC with Differential/Platelet - COMPLETE METABOLIC PANEL WITH GFR - Magnesium - Lipid panel - TSH - Hemoglobin A1c - Insulin, random - VITAMIN D 25 Hydroxyl  13. Prostate cancer screening  - PSA             Patient was counseled in prudent diet, weight control to achieve/maintain BMI less than 25, BP monitoring, regular exercise and medications as discussed.  Discussed med effects and SE's. Routine screening labs and tests as requested with regular follow-up as recommended.  Over 40 minutes of exam, counseling, chart review and high complex critical decision making was performed

## 2019-01-21 NOTE — Patient Instructions (Signed)

## 2019-01-22 DIAGNOSIS — Z111 Encounter for screening for respiratory tuberculosis: Secondary | ICD-10-CM | POA: Diagnosis not present

## 2019-01-22 LAB — URINALYSIS, ROUTINE W REFLEX MICROSCOPIC
Bilirubin Urine: NEGATIVE
Glucose, UA: NEGATIVE
Hgb urine dipstick: NEGATIVE
Ketones, ur: NEGATIVE
Leukocytes,Ua: NEGATIVE
Nitrite: NEGATIVE
Protein, ur: NEGATIVE
Specific Gravity, Urine: 1.015 (ref 1.001–1.03)
pH: 7.5 (ref 5.0–8.0)

## 2019-01-22 LAB — CBC WITH DIFFERENTIAL/PLATELET
Absolute Monocytes: 381 cells/uL (ref 200–950)
Basophils Absolute: 19 cells/uL (ref 0–200)
Basophils Relative: 0.4 %
Eosinophils Absolute: 80 cells/uL (ref 15–500)
Eosinophils Relative: 1.7 %
HCT: 39.2 % (ref 38.5–50.0)
HEMOGLOBIN: 13.6 g/dL (ref 13.2–17.1)
Lymphs Abs: 1053 cells/uL (ref 850–3900)
MCH: 32.5 pg (ref 27.0–33.0)
MCHC: 34.7 g/dL (ref 32.0–36.0)
MCV: 93.6 fL (ref 80.0–100.0)
MPV: 11.4 fL (ref 7.5–12.5)
Monocytes Relative: 8.1 %
NEUTROS ABS: 3168 {cells}/uL (ref 1500–7800)
Neutrophils Relative %: 67.4 %
Platelets: 239 10*3/uL (ref 140–400)
RBC: 4.19 10*6/uL — ABNORMAL LOW (ref 4.20–5.80)
RDW: 11.6 % (ref 11.0–15.0)
Total Lymphocyte: 22.4 %
WBC: 4.7 10*3/uL (ref 3.8–10.8)

## 2019-01-22 LAB — HEMOGLOBIN A1C
HEMOGLOBIN A1C: 5.4 %{Hb} (ref <5.7)
Mean Plasma Glucose: 108 (calc)
eAG (mmol/L): 6 (calc)

## 2019-01-22 LAB — MICROALBUMIN / CREATININE URINE RATIO
Creatinine, Urine: 79 mg/dL (ref 20–320)
Microalb Creat Ratio: 3 mcg/mg creat (ref <30)
Microalb, Ur: 0.2 mg/dL

## 2019-01-22 LAB — COMPLETE METABOLIC PANEL WITH GFR
AG Ratio: 1.8 (calc) (ref 1.0–2.5)
ALKALINE PHOSPHATASE (APISO): 42 U/L (ref 35–144)
ALT: 9 U/L (ref 9–46)
AST: 14 U/L (ref 10–35)
Albumin: 4.4 g/dL (ref 3.6–5.1)
BUN: 23 mg/dL (ref 7–25)
CO2: 29 mmol/L (ref 20–32)
CREATININE: 1.05 mg/dL (ref 0.70–1.33)
Calcium: 10.1 mg/dL (ref 8.6–10.3)
Chloride: 102 mmol/L (ref 98–110)
GFR, Est African American: 93 mL/min/{1.73_m2} (ref >=60)
GFR, Est Non African American: 81 mL/min/{1.73_m2} (ref >=60)
Globulin: 2.5 g/dL (calc) (ref 1.9–3.7)
Glucose, Bld: 94 mg/dL (ref 65–99)
Potassium: 4.3 mmol/L (ref 3.5–5.3)
SODIUM: 139 mmol/L (ref 135–146)
Total Bilirubin: 0.7 mg/dL (ref 0.2–1.2)
Total Protein: 6.9 g/dL (ref 6.1–8.1)

## 2019-01-22 LAB — LIPID PANEL
Cholesterol: 176 mg/dL (ref <200)
HDL: 50 mg/dL (ref >=40)
LDL Cholesterol (Calc): 101 mg/dL (calc) — ABNORMAL HIGH
Non-HDL Cholesterol (Calc): 126 mg/dL (calc) (ref <130)
Total CHOL/HDL Ratio: 3.5 (calc) (ref <5.0)
Triglycerides: 152 mg/dL — ABNORMAL HIGH (ref <150)

## 2019-01-22 LAB — INSULIN, RANDOM: Insulin: 22.3 u[IU]/mL — ABNORMAL HIGH

## 2019-01-22 LAB — TSH: TSH: 1.52 mIU/L (ref 0.40–4.50)

## 2019-01-22 LAB — VITAMIN D 25 HYDROXY (VIT D DEFICIENCY, FRACTURES): Vit D, 25-Hydroxy: 96 ng/mL (ref 30–100)

## 2019-01-22 LAB — IRON, TOTAL/TOTAL IRON BINDING CAP
%SAT: 32 % (calc) (ref 20–48)
Iron: 110 ug/dL (ref 50–180)
TIBC: 349 mcg/dL (calc) (ref 250–425)

## 2019-01-22 LAB — PSA: PSA: 0.7 ng/mL (ref <=4.0)

## 2019-01-22 LAB — VITAMIN B12: VITAMIN B 12: 623 pg/mL (ref 200–1100)

## 2019-01-22 LAB — TESTOSTERONE: Testosterone: 519 ng/dL (ref 250–827)

## 2019-01-22 LAB — MAGNESIUM: Magnesium: 2.1 mg/dL (ref 1.5–2.5)

## 2019-01-23 ENCOUNTER — Encounter: Payer: Self-pay | Admitting: Internal Medicine

## 2019-06-18 DIAGNOSIS — H5213 Myopia, bilateral: Secondary | ICD-10-CM | POA: Diagnosis not present

## 2019-08-17 ENCOUNTER — Other Ambulatory Visit: Payer: Self-pay

## 2019-08-17 DIAGNOSIS — Z20822 Contact with and (suspected) exposure to covid-19: Secondary | ICD-10-CM

## 2019-08-18 LAB — NOVEL CORONAVIRUS, NAA: SARS-CoV-2, NAA: NOT DETECTED

## 2019-12-31 ENCOUNTER — Ambulatory Visit: Payer: Self-pay | Attending: Internal Medicine

## 2019-12-31 DIAGNOSIS — Z23 Encounter for immunization: Secondary | ICD-10-CM | POA: Insufficient documentation

## 2019-12-31 NOTE — Progress Notes (Signed)
   Covid-19 Vaccination Clinic  Name:  Patrick Stone    MRN: [id number] DOB: 08-22-1966  12/31/2019  Patrick Stone was observed post Covid-19 immunization for 15 minutes without incident. He was provided with Vaccine Information Sheet and instruction to access the V-Safe system.   Patrick Stone was instructed to call 911 with any severe reactions post vaccine: Marland Kitchen Difficulty breathing  . Swelling of face and throat  . A fast heartbeat  . A bad rash all over body  . Dizziness and weakness

## 2020-01-26 ENCOUNTER — Encounter: Payer: BLUE CROSS/BLUE SHIELD | Admitting: Internal Medicine

## 2020-01-26 ENCOUNTER — Ambulatory Visit: Payer: Self-pay | Attending: Internal Medicine

## 2020-01-26 DIAGNOSIS — Z23 Encounter for immunization: Secondary | ICD-10-CM

## 2020-01-26 NOTE — Progress Notes (Signed)
   Covid-19 Vaccination Clinic  Name:  TYLIQUE AULL    MRN: [id number] DOB: 09-06-1966  01/26/2020  Mr. Holik was observed post Covid-19 immunization for 15 minutes without incident. He was provided with Vaccine Information Sheet and instruction to access the V-Safe system.   Mr. Husak was instructed to call 911 with any severe reactions post vaccine: Marland Kitchen Difficulty breathing  . Swelling of face and throat  . A fast heartbeat  . A bad rash all over body  . Dizziness and weakness   Immunizations Administered    Name Date Dose VIS Date Route   Pfizer COVID-19 Vaccine 01/26/2020  4:02 PM 0.3 mL 10/09/2019 Intramuscular   Manufacturer: [employer], [employer]   Lot: [id number]   NDC: [address]-1

## 2020-01-29 ENCOUNTER — Encounter: Payer: BLUE CROSS/BLUE SHIELD | Admitting: Internal Medicine

## 2020-02-02 DIAGNOSIS — D485 Neoplasm of uncertain behavior of skin: Secondary | ICD-10-CM | POA: Diagnosis not present

## 2020-02-02 DIAGNOSIS — L570 Actinic keratosis: Secondary | ICD-10-CM | POA: Diagnosis not present

## 2020-02-10 ENCOUNTER — Encounter: Payer: BLUE CROSS/BLUE SHIELD | Admitting: Internal Medicine

## 2020-02-21 ENCOUNTER — Encounter: Payer: Self-pay | Admitting: Internal Medicine

## 2020-02-21 NOTE — Progress Notes (Signed)
Annual  Screening/Preventative Visit  & Comprehensive Evaluation & Examination     This very nice 54 y.o. MWM presents for a Screening /Preventative Visit & comprehensive evaluation and management of multiple medical co-morbidities.  Patient has been followed expectantly for elevated BP , lipids, glucose intolerance and Vitamin D Deficiency.     Today patient is c/o 1 month hx/o pain/discomfort of the medial aspect of his Rt elbow aggravated by repetitive activities.      Patient has been followed since 2006 for labile HTN. Patient's BP has been controlled at home.  Today's BP is at goal - 120/68. Patient denies any cardiac symptoms as chest pain, palpitations, shortness of breath, dizziness or ankle swelling.     Patient's hyperlipidemia is controlled with diet and medications. Patient denies myalgias or other medication SE's. Last lipids were Near goal:  Lab Results  Component Value Date   CHOL 176 01/21/2019   HDL 50 01/21/2019   LDLCALC 101 (H) 01/21/2019   TRIG 152 (H) 01/21/2019   CHOLHDL 3.5 01/21/2019       Patient has hx/o prediabetes (A1c 5.9% / 2015) and patient denies reactive hypoglycemic symptoms, visual blurring, diabetic polys or paresthesias. Last A1c was Normal & at goal:  Lab Results  Component Value Date   HGBA1C 5.4 01/21/2019        Finally, patient has history of Vitamin D Deficiency ("27" / 2008)  and last vitamin D was at goal:  Lab Results  Component Value Date   VD25OH 96 01/21/2019    Current Outpatient Medications on File Prior to Visit  Medication Sig  . aspirin EC 81 MG tablet Take 81 mg by mouth daily.  . Cholecalciferol (VITAMIN D PO) Take 10,000 Units by mouth daily.   Marland Kitchen loratadine (CLARITIN) 10 MG tablet Take 10 mg by mouth daily.  . Multiple Vitamins-Minerals (MULTIVITAMIN PO) Take by mouth daily.   No current facility-administered medications on file prior to visit.   No Known Allergies   Past Medical History:  Diagnosis Date  .  Allergic rhinitis   . Elevated lipids   . Labile hypertension   . Vitamin D deficiency    Health Maintenance  Topic Date Due  . INFLUENZA VACCINE  05/29/2020  . COLONOSCOPY  01/09/2023  . TETANUS/TDAP  10/04/2025  . COVID-19 Vaccine  Completed  . HIV Screening  Completed   Immunization History  Administered Date(s) Administered  . Influenza Split 09/20/2014  . Influenza, Quadrivalent, Recombinant, Inj, Pf 07/13/2019  . Influenza, Seasonal, Injecte, Preservative Fre 10/05/2015  . Influenza-Unspecified 08/07/2013, 08/03/2017, 09/12/2018  . PFIZER SARS-COV-2 Vaccination 12/31/2019, 01/26/2020  . PPD Test 09/20/2014, 10/05/2015, 12/10/2016, 01/06/2018, 01/22/2019  . Tdap 10/05/2015  . Zoster Recombinat (Shingrix) 09/12/2018, 11/21/2018   Last Colon - Last Colon - 01/08/2018 - Dr Kerin Salen - recc 5 yr f/u due Mar 2024  Past Surgical History:  Procedure Laterality Date  . ANTERIOR CRUCIATE LIGAMENT REPAIR Left 1986  . ORIF ANKLE FRACTURE Right 2007   Family History  Problem Relation Age of Onset  . Depression Mother   . Cancer Father        colon   Social History   Socioeconomic History  . Marital status: Married    Spouse name: Lynden Ang  . Number of children: 2 sons - 21& 7 yo.  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 Smoking status: Never Smoker  . Smokeless tobacco: Never Used  Substance and Sexual Activity  .  Alcohol use: Yes    Alcohol/week: 0.0 standard drinks    Comment: Social  . Drug use: No  . Sexual activity: Not on file     ROS Constitutional: Denies fever, chills, weight loss/gain, headaches, insomnia,  night sweats or change in appetite. Does c/o fatigue. Eyes: Denies redness, blurred vision, diplopia, discharge, itchy or watery eyes.  ENT: Denies discharge, congestion, post nasal drip, epistaxis, sore throat, earache, hearing loss, dental pain, Tinnitus, Vertigo, Sinus pain or snoring.  Cardio: Denies chest pain,  palpitations, irregular heartbeat, syncope, dyspnea, diaphoresis, orthopnea, PND, claudication or edema Respiratory: denies cough, dyspnea, DOE, pleurisy, hoarseness, laryngitis or wheezing.  Gastrointestinal: Denies dysphagia, heartburn, reflux, water brash, pain, cramps, nausea, vomiting, bloating, diarrhea, constipation, hematemesis, melena, hematochezia, jaundice or hemorrhoids Genitourinary: Denies dysuria, frequency, urgency, nocturia, hesitancy, discharge, hematuria or flank pain Musculoskeletal: Denies arthralgia, myalgia, stiffness, Jt. Swelling, pain, limp or strain/sprain. Denies Falls. Skin: Denies puritis, rash, hives, warts, acne, eczema or change in skin lesion Neuro: No weakness, tremor, incoordination, spasms, paresthesia or pain Psychiatric: Denies confusion, memory loss or sensory loss. Denies Depression. Endocrine: Denies change in weight, skin, hair change, nocturia, and paresthesia, diabetic polys, visual blurring or hyper / hypo glycemic episodes.  Heme/Lymph: No excessive bleeding, bruising or enlarged lymph nodes.  Physical Exam  BP 120/68   Pulse 68   Temp (!) 97.2 F (36.2 C)   Resp 16   Ht 6' (1.829 m)   Wt 199 lb 3.2 oz (90.4 kg)   BMI 27.02 kg/m   General Appearance: Well nourished and well groomed and in no apparent distress.  Eyes: PERRLA, EOMs, conjunctiva no swelling or erythema, normal fundi and vessels. Sinuses: No frontal/maxillary tenderness ENT/Mouth: EACs patent / TMs  nl. Nares clear without erythema, swelling, mucoid exudates. Oral hygiene is good. No erythema, swelling, or exudate. Tongue normal, non-obstructing. Tonsils not swollen or erythematous. Hearing normal.  Neck: Supple, thyroid not palpable. No bruits, nodes or JVD. Respiratory: Respiratory effort normal.  BS equal and clear bilateral without rales, rhonci, wheezing or stridor. Cardio: Heart sounds are normal with regular rate and rhythm and no murmurs, rubs or gallops. Peripheral  pulses are normal and equal bilaterally without edema. No aortic or femoral bruits. Chest: symmetric with normal excursions and percussion.  Abdomen: Soft, with Nl bowel sounds. Nontender, no guarding, rebound, hernias, masses, or organomegaly.  Lymphatics: Non tender without lymphadenopathy.  Musculoskeletal: Full ROM all peripheral extremities, joint stability, 5/5 strength, and normal gait. (+) tender trigger point at Rt elbow medial epicondyle.  Skin: Warm and dry without rashes, lesions, cyanosis, clubbing or  ecchymosis.  Neuro: Cranial nerves intact, reflexes equal bilaterally. Normal muscle tone, no cerebellar symptoms. Sensation intact.  Pysch: Alert and oriented X 3 with normal affect, insight and judgment appropriate.   Assessment and Plan  1. Annual Preventative/Screening Exam   2. Labile hypertension  - EKG 12-Lead - [place], RETROPERITNL ABD,  LTD - CBC with Differential/Platelet - COMPLETE METABOLIC PANEL WITH GFR - Magnesium - TSH  3. Hyperlipidemia, mixed  - EKG 12-Lead - Urinalysis, Routine w reflex microscopic - Microalbumin / creatinine urine ratio - Lipid panel - TSH  4. Abnormal glucose  - Hemoglobin A1c - Insulin, random  5. Vitamin D deficiency  - VITAMIN D 25 Hydroxy  6. Prediabetes  7. Prostate cancer screening  - PSA  8. Screening for colorectal cancer  - POC Hemoccult Bld/Stl  9. Screening examination for pulmonary tuberculosis  - TB Skin Test  10. Screening  for ischemic heart disease  - EKG 12-Lead  11. Screening for AAA (aortic abdominal aneurysm)  - [place], RETROPERITNL ABD,  LTD  12. Fatigue  - Iron,Total/Total Iron Binding Cap - Vitamin B12 - Testosterone - CBC with Differential/Platelet - TSH  13. Medication management  - Urinalysis, Routine w reflex microscopic - Microalbumin / creatinine urine ratio - CBC with Differential/Platelet - COMPLETE METABOLIC PANEL WITH GFR - Magnesium - Lipid panel - TSH - Hemoglobin  A1c - Insulin, random - VITAMIN D 25 Hydroxy         Sent in Rx Decadron 10 day taper and recommended use an elbow elastic splint for his Medial Epicondylitis.             Patient was counseled in prudent diet, weight control to achieve/maintain BMI less than 25, BP monitoring, regular exercise and medications as discussed.  Discussed med effects and SE's. Routine screening labs and tests as requested with regular follow-up as recommended. Over 40 minutes of exam, counseling, chart review and high complex critical decision making was performed   Marinus Maw, MD

## 2020-02-21 NOTE — Patient Instructions (Addendum)
Due to recent changes in healthcare laws, you may see the results of your imaging and laboratory studies on MyChart before your provider has had a chance to review them.  We understand that in some cases there may be results that are confusing or concerning to you. Not all laboratory results come back in the same time frame and the provider may be waiting for multiple results in order to interpret others.  Please give us 48 hours in order for your provider to thoroughly review all the results before contacting the office for clarification of your results.   ++++++++++++++++++++++++++++++++++++++  Vit D  & Vit C 1,000 mg   are recommended to help protect  against the Covid-19 and other Corona viruses.    Also it's recommended  to take  Zinc 50 mg  to help  protect against the Covid-19   and best place to get  is also on [employer]  and don't pay more than 6-8 cents /pill !  =============================== Coronavirus (COVID-19) Are you at risk?  Are you at risk for the Coronavirus (COVID-19)?  To be considered HIGH RISK for Coronavirus (COVID-19), you have to meet the following criteria:  . Traveled to [place], [place], [place], [place] or [place]; or in the [place] to [place], [place], [place]  . or [place]; and have fever, cough, and shortness of breath within the last 2 weeks of travel OR . Been in close contact with a person diagnosed with COVID-19 within the last 2 weeks and have  . fever, cough,and shortness of breath .  . IF YOU DO NOT MEET THESE CRITERIA, YOU ARE CONSIDERED LOW RISK FOR COVID-19.  What to do if you are HIGH RISK for COVID-19?  . If you are having a medical emergency, call 911. . Seek medical care right away. Before you go to a doctor's office, urgent care or emergency department, .  call ahead and tell them about your recent travel, contact with someone diagnosed with COVID-19  .  and your symptoms.  . You should receive instructions from your  physician's office regarding next steps of care.  . When you arrive at healthcare provider, tell the healthcare staff immediately you have returned from  . visiting [place], [place], [place], [place] or [place]; or traveled in the [place] to [place], [place],  . [place] or [place] in the last two weeks or you have been in close contact with a person diagnosed with  . COVID-19 in the last 2 weeks.   . Tell the health care staff about your symptoms: fever, cough and shortness of breath. . After you have been seen by a medical provider, you will be either: o Tested for (COVID-19) and discharged home on quarantine except to seek medical care if  o symptoms worsen, and asked to  - Stay home and avoid contact with others until you get your results (4-5 days)  - Avoid travel on public transportation if possible (such as bus, train, or airplane) or o Sent to the Emergency Department by EMS for evaluation, COVID-19 testing  and  o possible admission depending on your condition and test results.  What to do if you are LOW RISK for COVID-19?  Reduce your risk of any infection by using the same precautions used for avoiding the common cold or flu:  . Wash your hands often with soap and warm water for at least 20 seconds.  If soap and water are not readily   available,  . use an alcohol-based hand sanitizer with at least 60% alcohol.  . If coughing or sneezing, cover your mouth and nose by coughing or sneezing into the elbow areas of your shirt or coat, .  into a tissue or into your sleeve (not your hands). . Avoid shaking hands with others and consider head nods or verbal greetings only. . Avoid touching your eyes, nose, or mouth with unwashed hands.  . Avoid close contact with people who are sick. . Avoid places or events with large numbers of people in one location, like concerts or sporting events. . Carefully consider travel plans you have or are making. . If you are planning any travel  outside or inside the [place], visit the CDC's Travelers' Health webpage for the latest health notices. . If you have some symptoms but not all symptoms, continue to monitor at home and seek medical attention  . if your symptoms worsen. . If you are having a medical emergency, call 911. >>>>>>>>>>>>>>>>>>>>>>>>>>>> Preventive Care for Adults  A healthy lifestyle and preventive care can promote health and wellness. Preventive health guidelines for men include the following key practices:  A routine yearly physical is a good way to check with your health care provider about your health and preventative screening. It is a chance to share any concerns and updates on your health and to receive a thorough exam.  Visit your dentist for a routine exam and preventative care every 6 months. Brush your teeth twice a day and floss once a day. Good oral hygiene prevents tooth decay and gum disease.  The frequency of eye exams is based on your age, health, family medical history, use of contact lenses, and other factors. Follow your health care provider's recommendations for frequency of eye exams.  Eat a healthy diet. Foods such as vegetables, fruits, whole grains, low-fat dairy products, and lean protein foods contain the nutrients you need without too many calories. Decrease your intake of foods high in solid fats, added sugars, and salt. Eat the right amount of calories for you. Get information about a proper diet from your health care provider, if necessary.  Regular physical exercise is one of the most important things you can do for your health. Most adults should get at least 150 minutes of moderate-intensity exercise (any activity that increases your heart rate and causes you to sweat) each week. In addition, most adults need muscle-strengthening exercises on 2 or more days a week.  Maintain a healthy weight. The body mass index (BMI) is a screening tool to identify possible weight problems. It provides an  estimate of body fat based on height and weight. Your health care provider can find your BMI and can help you achieve or maintain a healthy weight. For adults 20 years and older:  A BMI below 18.5 is considered underweight.  A BMI of 18.5 to 24.9 is normal.  A BMI of 25 to 29.9 is considered overweight.  A BMI of 30 and above is considered obese.  Maintain normal blood lipids and cholesterol levels by exercising and minimizing your intake of saturated fat. Eat a balanced diet with plenty of fruit and vegetables. Blood tests for lipids and cholesterol should begin at age 20 and be repeated every 5 years. If your lipid or cholesterol levels are high, you are over 50, or you are at high risk for heart disease, you may need your cholesterol levels checked more frequently. Ongoing high lipid and cholesterol levels should be treated   with medicines if diet and exercise are not working.  If you smoke, find out from your health care provider how to quit. If you do not use tobacco, do not start.  Lung cancer screening is recommended for adults aged 55-80 years who are at high risk for developing lung cancer because of a history of smoking. A yearly low-dose CT scan of the lungs is recommended for people who have at least a 30-pack-year history of smoking and are a current smoker or have quit within the past 15 years. A pack year of smoking is smoking an average of 1 pack of cigarettes a day for 1 year (for example: 1 pack a day for 30 years or 2 packs a day for 15 years). Yearly screening should continue until the smoker has stopped smoking for at least 15 years. Yearly screening should be stopped for people who develop a health problem that would prevent them from having lung cancer treatment.  If you choose to drink alcohol, do not have more than 2 drinks per day. One drink is considered to be 12 ounces (355 mL) of beer, 5 ounces (148 mL) of wine, or 1.5 ounces (44 mL) of liquor.  Avoid use of street  drugs. Do not share needles with anyone. Ask for help if you need support or instructions about stopping the use of drugs.  High blood pressure causes heart disease and increases the risk of stroke. Your blood pressure should be checked at least every 1-2 years. Ongoing high blood pressure should be treated with medicines, if weight loss and exercise are not effective.  If you are 45-79 years old, ask your health care provider if you should take aspirin to prevent heart disease.  Diabetes screening involves taking a blood sample to check your fasting blood sugar level. This should be done once every 3 years, after age 45, if you are within normal weight and without risk factors for diabetes. Testing should be considered at a younger age or be carried out more frequently if you are overweight and have at least 1 risk factor for diabetes.  Colorectal cancer can be detected and often prevented. Most routine colorectal cancer screening begins at the age of 50 and continues through age 75. However, your health care provider may recommend screening at an earlier age if you have risk factors for colon cancer. On a yearly basis, your health care provider may provide home test kits to check for hidden blood in the stool. Use of a small camera at the end of a tube to directly examine the colon (sigmoidoscopy or colonoscopy) can detect the earliest forms of colorectal cancer. Talk to your health care provider about this at age 50, when routine screening begins. Direct exam of the colon should be repeated every 5-10 years through age 75, unless early forms of precancerous polyps or small growths are found.   Talk with your health care provider about prostate cancer screening.  Testicular cancer screening isrecommended for adult males. Screening includes self-exam, a health care provider exam, and other screening tests. Consult with your health care provider about any symptoms you have or any concerns you have about  testicular cancer.  Use sunscreen. Apply sunscreen liberally and repeatedly throughout the day. You should seek shade when your shadow is shorter than you. Protect yourself by wearing long sleeves, pants, a wide-brimmed hat, and sunglasses year round, whenever you are outdoors.  Once a month, do a whole-body skin exam, using a mirror to look at   the skin on your back. Tell your health care provider about new moles, moles that have irregular borders, moles that are larger than a pencil eraser, or moles that have changed in shape or color.  Stay current with required vaccines (immunizations).  Influenza vaccine. All adults should be immunized every year.  Tetanus, diphtheria, and acellular pertussis (Td, Tdap) vaccine. An adult who has not previously received Tdap or who does not know his vaccine status should receive 1 dose of Tdap. This initial dose should be followed by tetanus and diphtheria toxoids (Td) booster doses every 10 years. Adults with an unknown or incomplete history of completing a 3-dose immunization series with Td-containing vaccines should begin or complete a primary immunization series including a Tdap dose. Adults should receive a Td booster every 10 years.  Varicella vaccine. An adult without evidence of immunity to varicella should receive 2 doses or a second dose if he has previously received 1 dose.  Human papillomavirus (HPV) vaccine. Males aged 13-21 years who have not received the vaccine previously should receive the 3-dose series. Males aged 22-26 years may be immunized. Immunization is recommended through the age of 26 years for any male who has sex with males and did not get any or all doses earlier. Immunization is recommended for any person with an immunocompromised condition through the age of 26 years if he did not get any or all doses earlier. During the 3-dose series, the second dose should be obtained 4-8 weeks after the first dose. The third dose should be obtained  24 weeks after the first dose and 16 weeks after the second dose.  Zoster vaccine. One dose is recommended for adults aged 60 years or older unless certain conditions are present.    PREVNAR  - Pneumococcal 13-valent conjugate (PCV13) vaccine. When indicated, a person who is uncertain of his immunization history and has no record of immunization should receive the PCV13 vaccine. An adult aged 19 years or older who has certain medical conditions and has not been previously immunized should receive 1 dose of PCV13 vaccine. This PCV13 should be followed with a dose of pneumococcal polysaccharide (PPSV23) vaccine. The PPSV23 vaccine dose should be obtained at least 1 r more year(s) after the dose of PCV13 vaccine. An adult aged 19 years or older who has certain medical conditions and previously received 1 or more doses of PPSV23 vaccine should receive 1 dose of PCV13. The PCV13 vaccine dose should be obtained 1 or more years after the last PPSV23 vaccine dose.    PNEUMOVAX - Pneumococcal polysaccharide (PPSV23) vaccine. When PCV13 is also indicated, PCV13 should be obtained first. All adults aged 65 years and older should be immunized. An adult younger than age 65 years who has certain medical conditions should be immunized. Any person who resides in a nursing home or long-term care facility should be immunized. An adult smoker should be immunized. People with an immunocompromised condition and certain other conditions should receive both PCV13 and PPSV23 vaccines. People with human immunodeficiency virus (HIV) infection should be immunized as soon as possible after diagnosis. Immunization during chemotherapy or radiation therapy should be avoided. Routine use of PPSV23 vaccine is not recommended for American Indians, [place] Natives, or people younger than 65 years unless there are medical conditions that require PPSV23 vaccine. When indicated, people who have unknown immunization and have no record of  immunization should receive PPSV23 vaccine. One-time revaccination 5 years after the first dose of PPSV23 is recommended for people aged   19-64 years who have chronic kidney failure, nephrotic syndrome, asplenia, or immunocompromised conditions. People who received 1-2 doses of PPSV23 before age 65 years should receive another dose of PPSV23 vaccine at age 65 years or later if at least 5 years have passed since the previous dose. Doses of PPSV23 are not needed for people immunized with PPSV23 at or after age 65 years.    Hepatitis A vaccine. Adults who wish to be protected from this disease, have certain high-risk conditions, work with hepatitis A-infected animals, work in hepatitis A research labs, or travel to or work in countries with a high rate of hepatitis A should be immunized. Adults who were previously unvaccinated and who anticipate close contact with an international adoptee during the first 60 days after arrival in the [place] from a country with a high rate of hepatitis A should be immunized.    Hepatitis B vaccine. Adults should be immunized if they wish to be protected from this disease, have certain high-risk conditions, may be exposed to blood or other infectious body fluids, are household contacts or sex partners of hepatitis B positive people, are clients or workers in certain care facilities, or travel to or work in countries with a high rate of hepatitis B.   Preventive Service / Frequency   Ages 40 to 64  Blood pressure check.  Lipid and cholesterol check  Lung cancer screening. / Every year if you are aged 55-80 years and have a 30-pack-year history of smoking and currently smoke or have quit within the past 15 years. Yearly screening is stopped once you have quit smoking for at least 15 years or develop a health problem that would prevent you from having lung cancer treatment.  Fecal occult blood test (FOBT) of stool. / Every year beginning at age 50 and continuing  until age 75. You may not have to do this test if you get a colonoscopy every 10 years.  Flexible sigmoidoscopy** or colonoscopy.** / Every 5 years for a flexible sigmoidoscopy or every 10 years for a colonoscopy beginning at age 50 and continuing until age 75. Screening for abdominal aortic aneurysm (AAA)  by ultrasound is recommended for people who have history of high blood pressure or who are current or former smokers. +++++++++++ Recommend Adult Low Dose Aspirin or  coated  Aspirin 81 mg daily  To reduce risk of Colon Cancer 40 %,  Skin Cancer 26 % ,  Malignant Melanoma 46%  and  Pancreatic cancer 60% ++++++++++++++++++++ Vitamin D goal  is between 70-100.  Please make sure that you are taking your Vitamin D as directed.  It is very important as a natural anti-inflammatory  helping hair, skin, and nails, as well as reducing stroke and heart attack risk.  It helps your bones and helps with mood. It also decreases numerous cancer risks so please take it as directed.  Low Vit D is associated with a 200-300% higher risk for CANCER  and 200-300% higher risk for HEART   ATTACK  &  STROKE.   ...................................... It is also associated with higher death rate at younger ages,  autoimmune diseases like Rheumatoid arthritis, Lupus, Multiple Sclerosis.    Also many other serious conditions, like depression, Alzheimer's Dementia, infertility, muscle aches, fatigue, fibromyalgia - just to name a few. +++++++++++++++++++++ Recommend the book "The END of DIETING" by Dr Joel Fuhrman  & the book "The END of DIABETES " by Dr Joel Fuhrman At [employer].com - get book & Audio CD's      Being diabetic has a  300% increased risk for heart attack, stroke, cancer, and alzheimer- type vascular dementia. It is very important that you work harder with diet by avoiding all foods that are white. Avoid white rice (brown & wild rice is OK), white potatoes (sweetpotatoes in moderation is OK), White  bread or wheat bread or anything made out of white flour like bagels, donuts, rolls, buns, biscuits, cakes, pastries, cookies, pizza crust, and pasta (made from white flour & egg whites) - vegetarian pasta or spinach or wheat pasta is OK. Multigrain breads like Arnold's or Pepper[employer], or multigrain sandwich thins or flatbreads.  Diet, exercise and weight loss can reverse and cure diabetes in the early stages.  Diet, exercise and weight loss is very important in the control and prevention of complications of diabetes which affects every system in your body, ie. Brain - dementia/stroke, eyes - glaucoma/blindness, heart - heart attack/heart failure, kidneys - dialysis, stomach - gastric paralysis, intestines - malabsorption, nerves - severe painful neuritis, circulation - gangrene & loss of a leg(s), and finally cancer and Alzheimers.    I recommend avoid fried & greasy foods,  sweets/candy, white rice (brown or wild rice or Quinoa is OK), white potatoes (sweet potatoes are OK) - anything made from white flour - bagels, doughnuts, rolls, buns, biscuits,white and wheat breads, pizza crust and traditional pasta made of white flour & egg white(vegetarian pasta or spinach or wheat pasta is OK).  Multi-grain bread is OK - like multi-grain flat bread or sandwich thins. Avoid alcohol in excess. Exercise is also important.    Eat all the vegetables you want - avoid meat, especially red meat and dairy - especially cheese.  Cheese is the most concentrated form of trans-fats which is the worst thing to clog up our arteries. Veggie cheese is OK which can be found in the fresh produce section at [employer] or [employer] or [employer]  ++++++++++++++++++++++ DASH Eating Plan  DASH stands for "Dietary Approaches to Stop Hypertension."   The DASH eating plan is a healthy eating plan that has been shown to reduce high blood pressure (hypertension). Additional health benefits may include reducing the risk of type 2  diabetes mellitus, heart disease, and stroke. The DASH eating plan may also help with weight loss. WHAT DO I NEED TO KNOW ABOUT THE DASH EATING PLAN? For the DASH eating plan, you will follow these general guidelines:  Choose foods with a percent daily value for sodium of less than 5% (as listed on the food label).  Use salt-free seasonings or herbs instead of table salt or sea salt.  Check with your health care provider or pharmacist before using salt substitutes.  Eat lower-sodium products, often labeled as "lower sodium" or "no salt added."  Eat fresh foods.  Eat more vegetables, fruits, and low-fat dairy products.  Choose whole grains. Look for the word "whole" as the first word in the ingredient list.  Choose fish   Limit sweets, desserts, sugars, and sugary drinks.  Choose heart-healthy fats.  Eat veggie cheese   Eat more home-cooked food and less restaurant, buffet, and fast food.  Limit fried foods.  Cook foods using methods other than frying.  Limit canned vegetables. If you do use them, rinse them well to decrease the sodium.  When eating at a restaurant, ask that your food be prepared with less salt, or no salt if possible.                        WHAT FOODS CAN I EAT? Read Dr Fara Olden Fuhrman's books on The End of Dieting & The End of Diabetes  Grains Whole grain or whole wheat bread. Brown rice. Whole grain or whole wheat pasta. Quinoa, bulgur, and whole grain cereals. Low-sodium cereals. Corn or whole wheat flour tortillas. Whole grain cornbread. Whole grain crackers. Low-sodium crackers.  Vegetables Fresh or frozen vegetables (raw, steamed, roasted, or grilled). Low-sodium or reduced-sodium tomato and vegetable juices. Low-sodium or reduced-sodium tomato sauce and paste. Low-sodium or reduced-sodium canned vegetables.   Fruits All fresh, canned (in natural juice), or frozen fruits.  Protein Products  All fish and seafood.  Dried beans, peas, or lentils.  Unsalted nuts and seeds. Unsalted canned beans.  Dairy Low-fat dairy products, such as skim or 1% milk, 2% or reduced-fat cheeses, low-fat ricotta or cottage cheese, or plain low-fat yogurt. Low-sodium or reduced-sodium cheeses.  Fats and Oils Tub margarines without trans fats. Light or reduced-fat mayonnaise and salad dressings (reduced sodium). Avocado. Safflower, olive, or canola oils. Natural peanut or almond butter.  Other Unsalted popcorn and pretzels. The items listed above may not be a complete list of recommended foods or beverages. Contact your dietitian for more options.  +++++++++++++++++++  WHAT FOODS ARE NOT RECOMMENDED? Grains/ White flour or wheat flour White bread. White pasta. White rice. Refined cornbread. Bagels and croissants. Crackers that contain trans fat.  Vegetables  Creamed or fried vegetables. Vegetables in a . Regular canned vegetables. Regular canned tomato sauce and paste. Regular tomato and vegetable juices.  Fruits Dried fruits. Canned fruit in light or heavy syrup. Fruit juice.  Meat and Other Protein Products Meat in general - RED meat & White meat.  Fatty cuts of meat. Ribs, chicken wings, all processed meats as bacon, sausage, bologna, salami, fatback, hot dogs, bratwurst and packaged luncheon meats.  Dairy Whole or 2% milk, cream, half-and-half, and cream cheese. Whole-fat or sweetened yogurt. Full-fat cheeses or blue cheese. Non-dairy creamers and whipped toppings. Processed cheese, cheese spreads, or cheese curds.  Condiments Onion and garlic salt, seasoned salt, table salt, and sea salt. Canned and packaged gravies. Worcestershire sauce. Tartar sauce. Barbecue sauce. Teriyaki sauce. Soy sauce, including reduced sodium. Steak sauce. Fish sauce. Oyster sauce. Cocktail sauce. Horseradish. Ketchup and mustard. Meat flavorings and tenderizers. Bouillon cubes. Hot sauce. Tabasco sauce. Marinades. Taco seasonings. Relishes.  Fats and Oils Butter,  stick margarine, lard, shortening and bacon fat. Coconut, palm kernel, or palm oils. Regular salad dressings.  Pickles and olives. Salted popcorn and pretzels.  The items listed above may not be a complete list of foods and beverages to avoid.    Golfer's Elbow  Golfer's elbow, also called medial epicondylitis, is a condition that results from inflammation of the strong bands of tissue (tendons) that attach your forearm muscles to the inside of your bone at the elbow. These tendons affect the muscles that bend the palm toward the wrist (flexion). The tendons become less flexible with age. This condition is called golfer's elbow because it is more common among people who constantly bend and twist their wrists, such as golfers. This injury is usually caused by overuse. What are the causes? This condition is caused by:  Repeatedly flexing, turning, or twisting your wrist.  Constantly gripping objects with your hands. What increases the risk? This condition is more likely to develop in people who play golf, baseball, or tennis. This injury is more common among people who have jobs that require the constant use of their hands, such  as:  Carpenters.  Butchers.  Musicians.  Typists. What are the signs or symptoms? This condition causes elbow pain that may spread to your forearm and upper arm. Symptoms of this condition include.  Pain at the inner elbow, forearm, or wrist.  Reduced grip strength. The pain may get worse when you bend your wrist downward. How is this diagnosed? This condition is diagnosed based on your symptoms, medical history, and a physical exam. During the exam, your health care provider may:  Test your grip strength.  Move your wrist to check for pain. You may also have an MRI to:  Confirm the diagnosis.  Look for other issues.  Check for tears in the ligaments, muscles, or tendons. How is this treated? Treatment for this condition includes:  Stopping  all activities that make you bend or twist your elbow or wrist and waiting until your pain and other symptoms go away before resuming those activities.  Wearing an elbow brace or wrist splint to restrict the movements that cause symptoms.  Icing your inner elbow, forearm, or wrist to relieve pain.  Taking NSAIDs or getting corticosteroid injections to reduce pain and swelling.  Doing stretching, range-of-motion, and strengthening exercises (physical therapy) as told by your health care provider. In rare cases, surgery may be needed if your condition does not improve. Follow these instructions at home: If you have a brace or splint:  Wear it as told by your health care provider.  Loosen it if your fingers tingle, become numb, or turn cold and blue.  Keep it clean. Managing pain, stiffness, and swelling   If directed, put ice on the injured area. ? Put ice in a plastic bag. ? Place a towel between your skin and the bag. ? Leave the ice on for 20 minutes, 2-3 times a day.  Move your fingers often to avoid stiffness.  Raise (elevate) the injured area above the level of your heart while you are sitting or lying down. Activity  Rest your injured area as told by your health care provider.  Return to your normal activities as told by your health care provider. Ask your health care provider what activities are safe for you.  Do exercises as told by your health care provider. Lifestyle  If your condition is caused by sports, work with a trainer to make sure that you: ? Have the correct technique. ? Are using the proper equipment.  If your condition is work related, talk with your employer about changes that can be made. General instructions  Take over-the-counter and prescription medicines only as told by your health care provider.  Do not use any products that contain nicotine or tobacco, such as cigarettes, e-cigarettes, and chewing tobacco. If you need help quitting, ask your  health care provider.  Keep all follow-up visits as told by your health care provider. This is important. How is this prevented?  Before and after activity: ? Warm up and stretch before being active. ? Cool down and stretch after being active. ? Give your body time to rest between periods of activity.  During activity: ? Make sure to use equipment that fits you. ? If you play golf, slow your golf swing to reduce shock in the arm when making contact with the ball.  Maintain physical fitness, including: ? Strength. ? Flexibility. ? Cardiovascular fitness. ? Endurance.  Do exercises to strengthen the forearm muscles. Contact a health care provider if:  Your pain does not improve or it gets worse.  You notice numbness in your hand. Get help right away if:  Your pain is severe.  You cannot move your wrist. Summary  Golfer's elbow, also called medial epicondylitis, is a condition that results from inflammation of the strong bands of tissue (tendons) that attach your forearm muscles to the inside of your bone at the elbow.  This injury usually results from overuse.  Symptoms of this condition include decreased grip strength and pain at the inner elbow, forearm, or wrist.  This injury is treated with rest, ice, medicines, physical therapy, and surgery as needed. This information is not intended to replace advice given to you by your health care provider. Make sure you discuss any questions you have with your health care provider. Document Revised: 02/05/2019 Document Reviewed: 08/21/2018 Elsevier Patient Education  2020 [employer].

## 2020-02-22 ENCOUNTER — Other Ambulatory Visit: Payer: Self-pay

## 2020-02-22 ENCOUNTER — Ambulatory Visit (INDEPENDENT_AMBULATORY_CARE_PROVIDER_SITE_OTHER): Payer: BC Managed Care – PPO | Admitting: Internal Medicine

## 2020-02-22 VITALS — BP 120/68 | HR 68 | Temp 97.2°F | Resp 16 | Ht 72.0 in | Wt 199.2 lb

## 2020-02-22 DIAGNOSIS — Z1212 Encounter for screening for malignant neoplasm of rectum: Secondary | ICD-10-CM

## 2020-02-22 DIAGNOSIS — Z1329 Encounter for screening for other suspected endocrine disorder: Secondary | ICD-10-CM

## 2020-02-22 DIAGNOSIS — Z79899 Other long term (current) drug therapy: Secondary | ICD-10-CM

## 2020-02-22 DIAGNOSIS — Z125 Encounter for screening for malignant neoplasm of prostate: Secondary | ICD-10-CM

## 2020-02-22 DIAGNOSIS — Z1389 Encounter for screening for other disorder: Secondary | ICD-10-CM | POA: Diagnosis not present

## 2020-02-22 DIAGNOSIS — R350 Frequency of micturition: Secondary | ICD-10-CM

## 2020-02-22 DIAGNOSIS — E782 Mixed hyperlipidemia: Secondary | ICD-10-CM

## 2020-02-22 DIAGNOSIS — R7303 Prediabetes: Secondary | ICD-10-CM

## 2020-02-22 DIAGNOSIS — Z1211 Encounter for screening for malignant neoplasm of colon: Secondary | ICD-10-CM

## 2020-02-22 DIAGNOSIS — Z130 Encounter for screening for diseases of the blood and blood-forming organs and certain disorders involving the immune mechanism: Secondary | ICD-10-CM

## 2020-02-22 DIAGNOSIS — E559 Vitamin D deficiency, unspecified: Secondary | ICD-10-CM | POA: Diagnosis not present

## 2020-02-22 DIAGNOSIS — Z13220 Encounter for screening for lipoid disorders: Secondary | ICD-10-CM | POA: Diagnosis not present

## 2020-02-22 DIAGNOSIS — N401 Enlarged prostate with lower urinary tract symptoms: Secondary | ICD-10-CM

## 2020-02-22 DIAGNOSIS — R0989 Other specified symptoms and signs involving the circulatory and respiratory systems: Secondary | ICD-10-CM | POA: Diagnosis not present

## 2020-02-22 DIAGNOSIS — Z111 Encounter for screening for respiratory tuberculosis: Secondary | ICD-10-CM | POA: Diagnosis not present

## 2020-02-22 DIAGNOSIS — Z0001 Encounter for general adult medical examination with abnormal findings: Secondary | ICD-10-CM

## 2020-02-22 DIAGNOSIS — Z131 Encounter for screening for diabetes mellitus: Secondary | ICD-10-CM | POA: Diagnosis not present

## 2020-02-22 DIAGNOSIS — R5383 Other fatigue: Secondary | ICD-10-CM

## 2020-02-22 DIAGNOSIS — R7309 Other abnormal glucose: Secondary | ICD-10-CM

## 2020-02-22 DIAGNOSIS — Z0000 Encounter for general adult medical examination without abnormal findings: Secondary | ICD-10-CM | POA: Diagnosis not present

## 2020-02-22 DIAGNOSIS — M7701 Medial epicondylitis, right elbow: Secondary | ICD-10-CM

## 2020-02-22 DIAGNOSIS — Z136 Encounter for screening for cardiovascular disorders: Secondary | ICD-10-CM

## 2020-02-22 MED ORDER — DEXAMETHASONE 4 MG PO TABS: ORAL_TABLET | ORAL | 0 refills | Status: DC

## 2020-02-23 LAB — CBC WITH DIFFERENTIAL/PLATELET
Absolute Monocytes: 522 cells/uL (ref 200–950)
Basophils Absolute: 29 cells/uL (ref 0–200)
Basophils Relative: 0.5 %
Eosinophils Absolute: 133 cells/uL (ref 15–500)
Eosinophils Relative: 2.3 %
HCT: 41.1 % (ref 38.5–50.0)
Hemoglobin: 13.9 g/dL (ref 13.2–17.1)
Lymphs Abs: 1357 cells/uL (ref 850–3900)
MCH: 31.2 pg (ref 27.0–33.0)
MCHC: 33.8 g/dL (ref 32.0–36.0)
MCV: 92.4 fL (ref 80.0–100.0)
MPV: 11.1 fL (ref 7.5–12.5)
Monocytes Relative: 9 %
Neutro Abs: 3758 cells/uL (ref 1500–7800)
Neutrophils Relative %: 64.8 %
Platelets: 237 10*3/uL (ref 140–400)
RBC: 4.45 10*6/uL (ref 4.20–5.80)
RDW: 12.1 % (ref 11.0–15.0)
Total Lymphocyte: 23.4 %
WBC: 5.8 10*3/uL (ref 3.8–10.8)

## 2020-02-23 LAB — COMPLETE METABOLIC PANEL WITH GFR
AG Ratio: 1.6 (calc) (ref 1.0–2.5)
ALT: 16 U/L (ref 9–46)
AST: 23 U/L (ref 10–35)
Albumin: 4.4 g/dL (ref 3.6–5.1)
Alkaline phosphatase (APISO): 46 U/L (ref 35–144)
BUN: 22 mg/dL (ref 7–25)
CO2: 31 mmol/L (ref 20–32)
Calcium: 9.8 mg/dL (ref 8.6–10.3)
Chloride: 103 mmol/L (ref 98–110)
Creat: 0.94 mg/dL (ref 0.70–1.33)
GFR, Est African American: 106 mL/min/{1.73_m2} (ref >=60)
GFR, Est Non African American: 92 mL/min/{1.73_m2} (ref >=60)
Globulin: 2.7 g/dL (calc) (ref 1.9–3.7)
Glucose, Bld: 85 mg/dL (ref 65–99)
Potassium: 4.6 mmol/L (ref 3.5–5.3)
Sodium: 140 mmol/L (ref 135–146)
Total Bilirubin: 0.8 mg/dL (ref 0.2–1.2)
Total Protein: 7.1 g/dL (ref 6.1–8.1)

## 2020-02-23 LAB — URINALYSIS, ROUTINE W REFLEX MICROSCOPIC
Bilirubin Urine: NEGATIVE
Glucose, UA: NEGATIVE
Hgb urine dipstick: NEGATIVE
Ketones, ur: NEGATIVE
Leukocytes,Ua: NEGATIVE
Nitrite: NEGATIVE
Protein, ur: NEGATIVE
Specific Gravity, Urine: 1.009 (ref 1.001–1.03)
pH: 7.5 (ref 5.0–8.0)

## 2020-02-23 LAB — MICROALBUMIN / CREATININE URINE RATIO
Creatinine, Urine: 36 mg/dL (ref 20–320)
Microalb, Ur: <0.2 mg/dL

## 2020-02-23 LAB — LIPID PANEL
Cholesterol: 218 mg/dL — ABNORMAL HIGH (ref <200)
HDL: 53 mg/dL (ref >=40)
LDL Cholesterol (Calc): 123 mg/dL (calc) — ABNORMAL HIGH
Non-HDL Cholesterol (Calc): 165 mg/dL (calc) — ABNORMAL HIGH (ref <130)
Total CHOL/HDL Ratio: 4.1 (calc) (ref <5.0)
Triglycerides: 271 mg/dL — ABNORMAL HIGH (ref <150)

## 2020-02-23 LAB — VITAMIN D 25 HYDROXY (VIT D DEFICIENCY, FRACTURES): Vit D, 25-Hydroxy: 82 ng/mL (ref 30–100)

## 2020-02-23 LAB — IRON, TOTAL/TOTAL IRON BINDING CAP
%SAT: 48 % (calc) (ref 20–48)
Iron: 188 ug/dL — ABNORMAL HIGH (ref 50–180)
TIBC: 392 mcg/dL (calc) (ref 250–425)

## 2020-02-23 LAB — MAGNESIUM: Magnesium: 2.2 mg/dL (ref 1.5–2.5)

## 2020-02-23 LAB — HEMOGLOBIN A1C
Hgb A1c MFr Bld: 5.5 % of total Hgb (ref <5.7)
Mean Plasma Glucose: 111 (calc)
eAG (mmol/L): 6.2 (calc)

## 2020-02-23 LAB — TSH: TSH: 1.46 mIU/L (ref 0.40–4.50)

## 2020-02-23 LAB — PSA: PSA: 1 ng/mL (ref <=4.0)

## 2020-02-23 LAB — VITAMIN B12: Vitamin B-12: 308 pg/mL (ref 200–1100)

## 2020-02-23 LAB — TESTOSTERONE: Testosterone: 451 ng/dL (ref 250–827)

## 2020-02-23 LAB — INSULIN, RANDOM: Insulin: 9.1 u[IU]/mL

## 2020-03-17 ENCOUNTER — Other Ambulatory Visit: Payer: Self-pay | Admitting: *Deleted

## 2020-03-17 DIAGNOSIS — Z1212 Encounter for screening for malignant neoplasm of rectum: Secondary | ICD-10-CM

## 2020-03-17 DIAGNOSIS — Z1211 Encounter for screening for malignant neoplasm of colon: Secondary | ICD-10-CM

## 2020-03-17 LAB — TB SKIN TEST
Induration: 0 mm
TB Skin Test: NEGATIVE

## 2020-03-17 LAB — POC HEMOCCULT BLD/STL (HOME/3-CARD/SCREEN)
Card #2 Fecal Occult Blod, POC: NEGATIVE
Card #3 Fecal Occult Blood, POC: NEGATIVE
Fecal Occult Blood, POC: NEGATIVE

## 2021-02-22 ENCOUNTER — Encounter: Payer: Self-pay | Admitting: Internal Medicine

## 2021-02-22 NOTE — Progress Notes (Signed)
Annual  Screening/Preventative Visit  & Comprehensive Evaluation & Examination   Future Appointments  Date Time Provider Department Center  02/23/2021  2:00 PM [name], MD [facility] None  02/27/2022  2:00 PM [name], MD [facility] None                                              This very nice 55 y.o. MWM presents for a Screening /Preventative Visit & comprehensive evaluation and management of multiple medical co-morbidities.  Patient has been followed expectantly for labile HTN, HLD, Prediabetes and Vitamin D Deficiency.      labileHTN predates circa 2006. Patient's BP has been controlled at home.  Today's BP: 116/72. Patient denies any cardiac symptoms as chest pain, palpitations, shortness of breath, dizziness or ankle swelling.      Patient's hyperlipidemia is controlled with diet and medications. Patient denies myalgias or other medication SE's. Last lipids were not at goal:  Lab Results  Component Value Date   CHOL 218 (H) 02/22/2020   HDL 53 02/22/2020   LDLCALC 123 (H) 02/22/2020   TRIG 271 (H) 02/22/2020   CHOLHDL 4.1 02/22/2020        Patient has hx/o prediabetes (A1c 5.9% /2015)and patient denies reactive hypoglycemic symptoms, visual blurring, diabetic polys or paresthesias. Last A1c was normal & at goal:  Lab Results  Component Value Date   HGBA1C 5.5 02/22/2020         Finally, patient has history of Vitamin D Deficiency ("27" /2008) and last vitamin D was at goal:  Lab Results  Component Value Date   VD25OH 82 02/22/2020    Current Outpatient Medications on File Prior to Visit  Medication Sig  . aspirin EC 81 MG tablet Take  daily.  Marland Kitchen VITAMIN D Take 10,000 Units  daily.   Marland Kitchen loratadine 10 MG tablet Take 10 mg daily.    Take  daily.    No Known Allergies  Past Medical History:  Diagnosis Date  . Allergic rhinitis   . Elevated lipids   . Labile hypertension   . Vitamin D deficiency     Health Maintenance  Topic Date Due   . COVID-19 Vaccine (3 - Booster for Pfizer series) 07/28/2020  . INFLUENZA VACCINE  05/29/2021  . COLONOSCOPY  01/09/2023  . TETANUS/TDAP  10/04/2025  . Hepatitis C Screening  Completed  . HIV Screening  Completed  . HPV VACCINES  Aged Out    Immunization History  Administered Date(s) Administered  . Influenza Inj Mdck Quad Pf 07/19/2020  . Influenza Split 09/20/2014  . Influenza, Quadrivalent 07/13/2019  . Influenza, Seasonal,  10/05/2015  . Influenza-Unspecified 08/07/2013, 08/03/2017, 09/12/2018  . PFIZER  SARS-COV-2 Vacc 12/31/2019, 01/26/2020  . PPD Test 01/06/2018, 01/22/2019, 02/22/2020  . Tdap 10/05/2015  . Zoster Recombinat (Shingrix) 09/12/2018, 11/21/2018     Last Colon - 01/08/2018 - Dr Kerin Salen - recc 5 yr f/u due Mar 2024  Past Surgical History:  Procedure Laterality Date  . ANTERIOR CRUCIATE LIGAMENT REPAIR Left 1986  . ORIF ANKLE FRACTURE Right 2007    Family History  Problem Relation Age of Onset  . Depression Mother   . Cancer Father        colon    Social History   Socioeconomic History  . Marital status: Married    Spouse name: Lynden Ang  . Number  of children: 2 sons - 72 & 39 yo.  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 Smoking status: Never Smoker  . Smokeless tobacco: Never Used  Substance and Sexual Activity  . Alcohol use: Yes    Comment: Social  . Drug use: No  . Sexual activity: Not on file    ROS Constitutional: Denies fever, chills, weight loss/gain, headaches, insomnia,  night sweats or change in appetite. Does c/o fatigue. Eyes: Denies redness, blurred vision, diplopia, discharge, itchy or watery eyes.  ENT: Denies discharge, congestion, post nasal drip, epistaxis, sore throat, earache, hearing loss, dental pain, Tinnitus, Vertigo, Sinus pain or snoring.  Cardio: Denies chest pain, palpitations, irregular heartbeat, syncope, dyspnea, diaphoresis, orthopnea, PND, claudication or  edema Respiratory: denies cough, dyspnea, DOE, pleurisy, hoarseness, laryngitis or wheezing.  Gastrointestinal: Denies dysphagia, heartburn, reflux, water brash, pain, cramps, nausea, vomiting, bloating, diarrhea, constipation, hematemesis, melena, hematochezia, jaundice or hemorrhoids Genitourinary: Denies dysuria, frequency, urgency, nocturia, hesitancy, discharge, hematuria or flank pain Musculoskeletal: Denies arthralgia, myalgia, stiffness, Jt. Swelling, pain, limp or strain/sprain. Denies Falls. Skin: Denies puritis, rash, hives, warts, acne, eczema or change in skin lesion Neuro: No weakness, tremor, incoordination, spasms, paresthesia or pain Psychiatric: Denies confusion, memory loss or sensory loss. Denies Depression. Endocrine: Denies change in weight, skin, hair change, nocturia, and paresthesia, diabetic polys, visual blurring or hyper / hypo glycemic episodes.  Heme/Lymph: No excessive bleeding, bruising or enlarged lymph nodes.  Physical Exam  BP 116/72   Pulse 65   Temp (!) 97.5 F (36.4 C)   Resp 16   Ht 6' 0.5" (1.842 m)   Wt 195 lb 9.6 oz (88.7 kg)   SpO2 98%   BMI 26.16 kg/m   General Appearance: Well nourished and well groomed and in no apparent distress.  Eyes: PERRLA, EOMs, conjunctiva no swelling or erythema, normal fundi and vessels. Sinuses: No frontal/maxillary tenderness ENT/Mouth: EACs patent / TMs  nl. Nares clear without erythema, swelling, mucoid exudates. Oral hygiene is good. No erythema, swelling, or exudate. Tongue normal, non-obstructing. Tonsils not swollen or erythematous. Hearing normal.  Neck: Supple, thyroid not palpable. No bruits, nodes or JVD. Respiratory: Respiratory effort normal.  BS equal and clear bilateral without rales, rhonci, wheezing or stridor. Cardio: Heart sounds are normal with regular rate and rhythm and no murmurs, rubs or gallops. Peripheral pulses are normal and equal bilaterally without edema. No aortic or femoral  bruits. Chest: symmetric with normal excursions and percussion.  Abdomen: Soft, with Nl bowel sounds. Nontender, no guarding, rebound, hernias, masses, or organomegaly.  Lymphatics: Non tender without lymphadenopathy.  Musculoskeletal: Full ROM all peripheral extremities, joint stability, 5/5 strength, and normal gait. Skin: Warm and dry without rashes, lesions, cyanosis, clubbing or  ecchymosis.  Neuro: Cranial nerves intact, reflexes equal bilaterally. Normal muscle tone, no cerebellar symptoms. Sensation intact.  Pysch: Alert and oriented X 3 with normal affect, insight and judgment appropriate.   Assessment and Plan  1. Annual Preventative/Screening Exam    2. Labile hypertension  - EKG 12-Lead - [place], RETROPERITNL ABD,  LTD - Urinalysis, Routine w reflex microscopic - CBC with Differential/Platelet - COMPLETE METABOLIC PANEL WITH GFR - Magnesium - TSH  3. Hyperlipidemia, mixed  - EKG 12-Lead - [place], RETROPERITNL ABD,  LTD - Lipid panel - TSH  4. Abnormal glucose  - EKG 12-Lead - [place], RETROPERITNL ABD,  LTD - Hemoglobin A1c - Insulin, random  5. Vitamin D deficiency  - VITAMIN  D 25 Hydroxy   6. Prostate cancer screening  - PSA  7. Screening for colorectal cancer  - POC Hemoccult Bld/Stl   8. Screening examination for pulmonary tuberculosis  - TB Skin Test  9. Screening for ischemic heart disease  - EKG 12-Lead  10. Screening for AAA (aortic abdominal aneurysm)  - [place], RETROPERITNL ABD,  LTD  11. Fatigue  - Iron,Total/Total Iron Binding Cap - Vitamin B12 - Testosterone - CBC with Differential/Platelet - TSH  12. Medication management  - Urinalysis, Routine w reflex microscopic - Microalbumin / creatinine urine ratio - CBC with Differential/Platelet - COMPLETE METABOLIC PANEL WITH GFR - Magnesium - Lipid panel - TSH - Hemoglobin A1c - Insulin, random - VITAMIN D 25 Hydroxy         Patient was counseled in prudent diet, weight control to  achieve/maintain BMI less than 25, BP monitoring, regular exercise and medications as discussed.  Discussed med effects and SE's. Routine screening labs and tests as requested with regular follow-up as recommended. Over 40 minutes of exam, counseling, chart review and high complex critical decision making was performed   Marinus Maw, MD

## 2021-02-22 NOTE — Patient Instructions (Signed)
Due to recent changes in healthcare laws, you may see the results of your imaging and laboratory studies on MyChart before your provider has had a chance to review them.  We understand that in some cases there may be results that are confusing or concerning to you. Not all laboratory results come back in the same time frame and the provider may be waiting for multiple results in order to interpret others.  Please give us 48 hours in order for your provider to thoroughly review all the results before contacting the office for clarification of your results.   ++++++++++++++++++++++++++++++++++++++  Vit D  & Vit C 1,000 mg   are recommended to help protect  against the Covid-19 and other Corona viruses.    Also it's recommended  to take  Zinc 50 mg  to help  protect against the Covid-19   and best place to get  is also on [employer]  and don't pay more than 6-8 cents /pill !  =============================== Coronavirus (COVID-19) Are you at risk?  Are you at risk for the Coronavirus (COVID-19)?  To be considered HIGH RISK for Coronavirus (COVID-19), you have to meet the following criteria:  . Traveled to [place], [place], [place], [place] or [place]; or in the [place] to [place], [place], [place]  . or [place]; and have fever, cough, and shortness of breath within the last 2 weeks of travel OR . Been in close contact with a person diagnosed with COVID-19 within the last 2 weeks and have  . fever, cough,and shortness of breath .  . IF YOU DO NOT MEET THESE CRITERIA, YOU ARE CONSIDERED LOW RISK FOR COVID-19.  What to do if you are HIGH RISK for COVID-19?  . If you are having a medical emergency, call 911. . Seek medical care right away. Before you go to a doctor's office, urgent care or emergency department, .  call ahead and tell them about your recent travel, contact with someone diagnosed with COVID-19  .  and your symptoms.  . You should receive instructions from your  physician's office regarding next steps of care.  . When you arrive at healthcare provider, tell the healthcare staff immediately you have returned from  . visiting [place], [place], [place], [place] or [place]; or traveled in the [place] to [place], [place],  . [place] or [place] in the last two weeks or you have been in close contact with a person diagnosed with  . COVID-19 in the last 2 weeks.   . Tell the health care staff about your symptoms: fever, cough and shortness of breath. . After you have been seen by a medical provider, you will be either: o Tested for (COVID-19) and discharged home on quarantine except to seek medical care if  o symptoms worsen, and asked to  - Stay home and avoid contact with others until you get your results (4-5 days)  - Avoid travel on public transportation if possible (such as bus, train, or airplane) or o Sent to the Emergency Department by EMS for evaluation, COVID-19 testing  and  o possible admission depending on your condition and test results.  What to do if you are LOW RISK for COVID-19?  Reduce your risk of any infection by using the same precautions used for avoiding the common cold or flu:  . Wash your hands often with soap and warm water for at least 20 seconds.  If soap and water are not readily   available,  . use an alcohol-based hand sanitizer with at least 60% alcohol.  . If coughing or sneezing, cover your mouth and nose by coughing or sneezing into the elbow areas of your shirt or coat, .  into a tissue or into your sleeve (not your hands). . Avoid shaking hands with others and consider head nods or verbal greetings only. . Avoid touching your eyes, nose, or mouth with unwashed hands.  . Avoid close contact with people who are sick. . Avoid places or events with large numbers of people in one location, like concerts or sporting events. . Carefully consider travel plans you have or are making. . If you are planning any travel  outside or inside the [place], visit the CDC's Travelers' Health webpage for the latest health notices. . If you have some symptoms but not all symptoms, continue to monitor at home and seek medical attention  . if your symptoms worsen. . If you are having a medical emergency, call 911. >>>>>>>>>>>>>>>>>>>>>>>>>>>> Preventive Care for Adults  A healthy lifestyle and preventive care can promote health and wellness. Preventive health guidelines for men include the following key practices:  A routine yearly physical is a good way to check with your health care provider about your health and preventative screening. It is a chance to share any concerns and updates on your health and to receive a thorough exam.  Visit your dentist for a routine exam and preventative care every 6 months. Brush your teeth twice a day and floss once a day. Good oral hygiene prevents tooth decay and gum disease.  The frequency of eye exams is based on your age, health, family medical history, use of contact lenses, and other factors. Follow your health care provider's recommendations for frequency of eye exams.  Eat a healthy diet. Foods such as vegetables, fruits, whole grains, low-fat dairy products, and lean protein foods contain the nutrients you need without too many calories. Decrease your intake of foods high in solid fats, added sugars, and salt. Eat the right amount of calories for you. Get information about a proper diet from your health care provider, if necessary.  Regular physical exercise is one of the most important things you can do for your health. Most adults should get at least 150 minutes of moderate-intensity exercise (any activity that increases your heart rate and causes you to sweat) each week. In addition, most adults need muscle-strengthening exercises on 2 or more days a week.  Maintain a healthy weight. The body mass index (BMI) is a screening tool to identify possible weight problems. It provides an  estimate of body fat based on height and weight. Your health care provider can find your BMI and can help you achieve or maintain a healthy weight. For adults 20 years and older:  A BMI below 18.5 is considered underweight.  A BMI of 18.5 to 24.9 is normal.  A BMI of 25 to 29.9 is considered overweight.  A BMI of 30 and above is considered obese.  Maintain normal blood lipids and cholesterol levels by exercising and minimizing your intake of saturated fat. Eat a balanced diet with plenty of fruit and vegetables. Blood tests for lipids and cholesterol should begin at age 20 and be repeated every 5 years. If your lipid or cholesterol levels are high, you are over 50, or you are at high risk for heart disease, you may need your cholesterol levels checked more frequently. Ongoing high lipid and cholesterol levels should be treated   with medicines if diet and exercise are not working.  If you smoke, find out from your health care provider how to quit. If you do not use tobacco, do not start.  Lung cancer screening is recommended for adults aged 55-80 years who are at high risk for developing lung cancer because of a history of smoking. A yearly low-dose CT scan of the lungs is recommended for people who have at least a 30-pack-year history of smoking and are a current smoker or have quit within the past 15 years. A pack year of smoking is smoking an average of 1 pack of cigarettes a day for 1 year (for example: 1 pack a day for 30 years or 2 packs a day for 15 years). Yearly screening should continue until the smoker has stopped smoking for at least 15 years. Yearly screening should be stopped for people who develop a health problem that would prevent them from having lung cancer treatment.  If you choose to drink alcohol, do not have more than 2 drinks per day. One drink is considered to be 12 ounces (355 mL) of beer, 5 ounces (148 mL) of wine, or 1.5 ounces (44 mL) of liquor.  Avoid use of street  drugs. Do not share needles with anyone. Ask for help if you need support or instructions about stopping the use of drugs.  High blood pressure causes heart disease and increases the risk of stroke. Your blood pressure should be checked at least every 1-2 years. Ongoing high blood pressure should be treated with medicines, if weight loss and exercise are not effective.  If you are 45-79 years old, ask your health care provider if you should take aspirin to prevent heart disease.  Diabetes screening involves taking a blood sample to check your fasting blood sugar level. This should be done once every 3 years, after age 45, if you are within normal weight and without risk factors for diabetes. Testing should be considered at a younger age or be carried out more frequently if you are overweight and have at least 1 risk factor for diabetes.  Colorectal cancer can be detected and often prevented. Most routine colorectal cancer screening begins at the age of 50 and continues through age 75. However, your health care provider may recommend screening at an earlier age if you have risk factors for colon cancer. On a yearly basis, your health care provider may provide home test kits to check for hidden blood in the stool. Use of a small camera at the end of a tube to directly examine the colon (sigmoidoscopy or colonoscopy) can detect the earliest forms of colorectal cancer. Talk to your health care provider about this at age 50, when routine screening begins. Direct exam of the colon should be repeated every 5-10 years through age 75, unless early forms of precancerous polyps or small growths are found.   Talk with your health care provider about prostate cancer screening.  Testicular cancer screening isrecommended for adult males. Screening includes self-exam, a health care provider exam, and other screening tests. Consult with your health care provider about any symptoms you have or any concerns you have about  testicular cancer.  Use sunscreen. Apply sunscreen liberally and repeatedly throughout the day. You should seek shade when your shadow is shorter than you. Protect yourself by wearing long sleeves, pants, a wide-brimmed hat, and sunglasses year round, whenever you are outdoors.  Once a month, do a whole-body skin exam, using a mirror to look at   the skin on your back. Tell your health care provider about new moles, moles that have irregular borders, moles that are larger than a pencil eraser, or moles that have changed in shape or color.  Stay current with required vaccines (immunizations).  Influenza vaccine. All adults should be immunized every year.  Tetanus, diphtheria, and acellular pertussis (Td, Tdap) vaccine. An adult who has not previously received Tdap or who does not know his vaccine status should receive 1 dose of Tdap. This initial dose should be followed by tetanus and diphtheria toxoids (Td) booster doses every 10 years. Adults with an unknown or incomplete history of completing a 3-dose immunization series with Td-containing vaccines should begin or complete a primary immunization series including a Tdap dose. Adults should receive a Td booster every 10 years.  Varicella vaccine. An adult without evidence of immunity to varicella should receive 2 doses or a second dose if he has previously received 1 dose.  Human papillomavirus (HPV) vaccine. Males aged 13-21 years who have not received the vaccine previously should receive the 3-dose series. Males aged 22-26 years may be immunized. Immunization is recommended through the age of 26 years for any male who has sex with males and did not get any or all doses earlier. Immunization is recommended for any person with an immunocompromised condition through the age of 26 years if he did not get any or all doses earlier. During the 3-dose series, the second dose should be obtained 4-8 weeks after the first dose. The third dose should be obtained  24 weeks after the first dose and 16 weeks after the second dose.  Zoster vaccine. One dose is recommended for adults aged 60 years or older unless certain conditions are present.    PREVNAR  - Pneumococcal 13-valent conjugate (PCV13) vaccine. When indicated, a person who is uncertain of his immunization history and has no record of immunization should receive the PCV13 vaccine. An adult aged 19 years or older who has certain medical conditions and has not been previously immunized should receive 1 dose of PCV13 vaccine. This PCV13 should be followed with a dose of pneumococcal polysaccharide (PPSV23) vaccine. The PPSV23 vaccine dose should be obtained at least 1 r more year(s) after the dose of PCV13 vaccine. An adult aged 19 years or older who has certain medical conditions and previously received 1 or more doses of PPSV23 vaccine should receive 1 dose of PCV13. The PCV13 vaccine dose should be obtained 1 or more years after the last PPSV23 vaccine dose.    PNEUMOVAX - Pneumococcal polysaccharide (PPSV23) vaccine. When PCV13 is also indicated, PCV13 should be obtained first. All adults aged 65 years and older should be immunized. An adult younger than age 65 years who has certain medical conditions should be immunized. Any person who resides in a nursing home or long-term care facility should be immunized. An adult smoker should be immunized. People with an immunocompromised condition and certain other conditions should receive both PCV13 and PPSV23 vaccines. People with human immunodeficiency virus (HIV) infection should be immunized as soon as possible after diagnosis. Immunization during chemotherapy or radiation therapy should be avoided. Routine use of PPSV23 vaccine is not recommended for American Indians, [place] Natives, or people younger than 65 years unless there are medical conditions that require PPSV23 vaccine. When indicated, people who have unknown immunization and have no record of  immunization should receive PPSV23 vaccine. One-time revaccination 5 years after the first dose of PPSV23 is recommended for people aged   19-64 years who have chronic kidney failure, nephrotic syndrome, asplenia, or immunocompromised conditions. People who received 1-2 doses of PPSV23 before age 65 years should receive another dose of PPSV23 vaccine at age 65 years or later if at least 5 years have passed since the previous dose. Doses of PPSV23 are not needed for people immunized with PPSV23 at or after age 65 years.    Hepatitis A vaccine. Adults who wish to be protected from this disease, have certain high-risk conditions, work with hepatitis A-infected animals, work in hepatitis A research labs, or travel to or work in countries with a high rate of hepatitis A should be immunized. Adults who were previously unvaccinated and who anticipate close contact with an international adoptee during the first 60 days after arrival in the [place] from a country with a high rate of hepatitis A should be immunized.    Hepatitis B vaccine. Adults should be immunized if they wish to be protected from this disease, have certain high-risk conditions, may be exposed to blood or other infectious body fluids, are household contacts or sex partners of hepatitis B positive people, are clients or workers in certain care facilities, or travel to or work in countries with a high rate of hepatitis B.   Preventive Service / Frequency   Ages 40 to 64  Blood pressure check.  Lipid and cholesterol check  Lung cancer screening. / Every year if you are aged 55-80 years and have a 30-pack-year history of smoking and currently smoke or have quit within the past 15 years. Yearly screening is stopped once you have quit smoking for at least 15 years or develop a health problem that would prevent you from having lung cancer treatment.  Fecal occult blood test (FOBT) of stool. / Every year beginning at age 50 and continuing  until age 75. You may not have to do this test if you get a colonoscopy every 10 years.  Flexible sigmoidoscopy** or colonoscopy.** / Every 5 years for a flexible sigmoidoscopy or every 10 years for a colonoscopy beginning at age 50 and continuing until age 75. Screening for abdominal aortic aneurysm (AAA)  by ultrasound is recommended for people who have history of high blood pressure or who are current or former smokers. +++++++++++ Recommend Adult Low Dose Aspirin or  coated  Aspirin 81 mg daily  To reduce risk of Colon Cancer 40 %,  Skin Cancer 26 % ,  Malignant Melanoma 46%  and  Pancreatic cancer 60% ++++++++++++++++++++ Vitamin D goal  is between 70-100.  Please make sure that you are taking your Vitamin D as directed.  It is very important as a natural anti-inflammatory  helping hair, skin, and nails, as well as reducing stroke and heart attack risk.  It helps your bones and helps with mood. It also decreases numerous cancer risks so please take it as directed.  Low Vit D is associated with a 200-300% higher risk for CANCER  and 200-300% higher risk for HEART   ATTACK  &  STROKE.   ...................................... It is also associated with higher death rate at younger ages,  autoimmune diseases like Rheumatoid arthritis, Lupus, Multiple Sclerosis.    Also many other serious conditions, like depression, Alzheimer's Dementia, infertility, muscle aches, fatigue, fibromyalgia - just to name a few. +++++++++++++++++++++ Recommend the book "The END of DIETING" by Dr Joel Fuhrman  & the book "The END of DIABETES " by Dr Joel Fuhrman At [employer]com - get book & Audio CD's      Being diabetic has a  300% increased risk for heart attack, stroke, cancer, and alzheimer- type vascular dementia. It is very important that you work harder with diet by avoiding all foods that are white. Avoid white rice (brown & wild rice is OK), white potatoes (sweetpotatoes in moderation is OK), White  bread or wheat bread or anything made out of white flour like bagels, donuts, rolls, buns, biscuits, cakes, pastries, cookies, pizza crust, and pasta (made from white flour & egg whites) - vegetarian pasta or spinach or wheat pasta is OK. Multigrain breads like Arnold's or Pepper[employer], or multigrain sandwich thins or flatbreads.  Diet, exercise and weight loss can reverse and cure diabetes in the early stages.  Diet, exercise and weight loss is very important in the control and prevention of complications of diabetes which affects every system in your body, ie. Brain - dementia/stroke, eyes - glaucoma/blindness, heart - heart attack/heart failure, kidneys - dialysis, stomach - gastric paralysis, intestines - malabsorption, nerves - severe painful neuritis, circulation - gangrene & loss of a leg(s), and finally cancer and Alzheimers.    I recommend avoid fried & greasy foods,  sweets/candy, white rice (brown or wild rice or Quinoa is OK), white potatoes (sweet potatoes are OK) - anything made from white flour - bagels, doughnuts, rolls, buns, biscuits,white and wheat breads, pizza crust and traditional pasta made of white flour & egg white(vegetarian pasta or spinach or wheat pasta is OK).  Multi-grain bread is OK - like multi-grain flat bread or sandwich thins. Avoid alcohol in excess. Exercise is also important.    Eat all the vegetables you want - avoid meat, especially red meat and dairy - especially cheese.  Cheese is the most concentrated form of trans-fats which is the worst thing to clog up our arteries. Veggie cheese is OK which can be found in the fresh produce section at [employer] or [employer] or [employer]  ++++++++++++++++++++++ DASH Eating Plan  DASH stands for "Dietary Approaches to Stop Hypertension."   The DASH eating plan is a healthy eating plan that has been shown to reduce high blood pressure (hypertension). Additional health benefits may include reducing the risk of type 2  diabetes mellitus, heart disease, and stroke. The DASH eating plan may also help with weight loss. WHAT DO I NEED TO KNOW ABOUT THE DASH EATING PLAN? For the DASH eating plan, you will follow these general guidelines:  Choose foods with a percent daily value for sodium of less than 5% (as listed on the food label).  Use salt-free seasonings or herbs instead of table salt or sea salt.  Check with your health care provider or pharmacist before using salt substitutes.  Eat lower-sodium products, often labeled as "lower sodium" or "no salt added."  Eat fresh foods.  Eat more vegetables, fruits, and low-fat dairy products.  Choose whole grains. Look for the word "whole" as the first word in the ingredient list.  Choose fish   Limit sweets, desserts, sugars, and sugary drinks.  Choose heart-healthy fats.  Eat veggie cheese   Eat more home-cooked food and less restaurant, buffet, and fast food.  Limit fried foods.  Cook foods using methods other than frying.  Limit canned vegetables. If you do use them, rinse them well to decrease the sodium.  When eating at a restaurant, ask that your food be prepared with less salt, or no salt if possible.                        WHAT FOODS CAN I EAT? Read Dr Joel Fuhrman's books on The End of Dieting & The End of Diabetes  Grains Whole grain or whole wheat bread. Brown rice. Whole grain or whole wheat pasta. Quinoa, bulgur, and whole grain cereals. Low-sodium cereals. Corn or whole wheat flour tortillas. Whole grain cornbread. Whole grain crackers. Low-sodium crackers.  Vegetables Fresh or frozen vegetables (raw, steamed, roasted, or grilled). Low-sodium or reduced-sodium tomato and vegetable juices. Low-sodium or reduced-sodium tomato sauce and paste. Low-sodium or reduced-sodium canned vegetables.   Fruits All fresh, canned (in natural juice), or frozen fruits.  Protein Products  All fish and seafood.  Dried beans, peas, or lentils.  Unsalted nuts and seeds. Unsalted canned beans.  Dairy Low-fat dairy products, such as skim or 1% milk, 2% or reduced-fat cheeses, low-fat ricotta or cottage cheese, or plain low-fat yogurt. Low-sodium or reduced-sodium cheeses.  Fats and Oils Tub margarines without trans fats. Light or reduced-fat mayonnaise and salad dressings (reduced sodium). Avocado. Safflower, olive, or canola oils. Natural peanut or almond butter.  Other Unsalted popcorn and pretzels. The items listed above may not be a complete list of recommended foods or beverages. Contact your dietitian for more options.  +++++++++++++++++++  WHAT FOODS ARE NOT RECOMMENDED? Grains/ White flour or wheat flour White bread. White pasta. White rice. Refined cornbread. Bagels and croissants. Crackers that contain trans fat.  Vegetables  Creamed or fried vegetables. Vegetables in a . Regular canned vegetables. Regular canned tomato sauce and paste. Regular tomato and vegetable juices.  Fruits Dried fruits. Canned fruit in light or heavy syrup. Fruit juice.  Meat and Other Protein Products Meat in general - RED meat & White meat.  Fatty cuts of meat. Ribs, chicken wings, all processed meats as bacon, sausage, bologna, salami, fatback, hot dogs, bratwurst and packaged luncheon meats.  Dairy Whole or 2% milk, cream, half-and-half, and cream cheese. Whole-fat or sweetened yogurt. Full-fat cheeses or blue cheese. Non-dairy creamers and whipped toppings. Processed cheese, cheese spreads, or cheese curds.  Condiments Onion and garlic salt, seasoned salt, table salt, and sea salt. Canned and packaged gravies. Worcestershire sauce. Tartar sauce. Barbecue sauce. Teriyaki sauce. Soy sauce, including reduced sodium. Steak sauce. Fish sauce. Oyster sauce. Cocktail sauce. Horseradish. Ketchup and mustard. Meat flavorings and tenderizers. Bouillon cubes. Hot sauce. Tabasco sauce. Marinades. Taco seasonings. Relishes.  Fats and Oils Butter,  stick margarine, lard, shortening and bacon fat. Coconut, palm kernel, or palm oils. Regular salad dressings.  Pickles and olives. Salted popcorn and pretzels.  The items listed above may not be a complete list of foods and beverages to avoid.    

## 2021-02-23 ENCOUNTER — Other Ambulatory Visit: Payer: Self-pay

## 2021-02-23 ENCOUNTER — Ambulatory Visit (INDEPENDENT_AMBULATORY_CARE_PROVIDER_SITE_OTHER): Payer: BC Managed Care – PPO | Admitting: Internal Medicine

## 2021-02-23 VITALS — BP 116/72 | HR 65 | Temp 97.5°F | Resp 16 | Ht 72.5 in | Wt 195.6 lb

## 2021-02-23 DIAGNOSIS — Z1212 Encounter for screening for malignant neoplasm of rectum: Secondary | ICD-10-CM

## 2021-02-23 DIAGNOSIS — E785 Hyperlipidemia, unspecified: Secondary | ICD-10-CM

## 2021-02-23 DIAGNOSIS — E782 Mixed hyperlipidemia: Secondary | ICD-10-CM

## 2021-02-23 DIAGNOSIS — N401 Enlarged prostate with lower urinary tract symptoms: Secondary | ICD-10-CM

## 2021-02-23 DIAGNOSIS — E559 Vitamin D deficiency, unspecified: Secondary | ICD-10-CM | POA: Diagnosis not present

## 2021-02-23 DIAGNOSIS — R350 Frequency of micturition: Secondary | ICD-10-CM

## 2021-02-23 DIAGNOSIS — R0989 Other specified symptoms and signs involving the circulatory and respiratory systems: Secondary | ICD-10-CM | POA: Diagnosis not present

## 2021-02-23 DIAGNOSIS — Z0000 Encounter for general adult medical examination without abnormal findings: Secondary | ICD-10-CM

## 2021-02-23 DIAGNOSIS — R7309 Other abnormal glucose: Secondary | ICD-10-CM

## 2021-02-23 DIAGNOSIS — Z13220 Encounter for screening for lipoid disorders: Secondary | ICD-10-CM

## 2021-02-23 DIAGNOSIS — Z130 Encounter for screening for diseases of the blood and blood-forming organs and certain disorders involving the immune mechanism: Secondary | ICD-10-CM

## 2021-02-23 DIAGNOSIS — R5383 Other fatigue: Secondary | ICD-10-CM

## 2021-02-23 DIAGNOSIS — Z136 Encounter for screening for cardiovascular disorders: Secondary | ICD-10-CM

## 2021-02-23 DIAGNOSIS — Z131 Encounter for screening for diabetes mellitus: Secondary | ICD-10-CM

## 2021-02-23 DIAGNOSIS — Z111 Encounter for screening for respiratory tuberculosis: Secondary | ICD-10-CM

## 2021-02-23 DIAGNOSIS — Z125 Encounter for screening for malignant neoplasm of prostate: Secondary | ICD-10-CM | POA: Diagnosis not present

## 2021-02-23 DIAGNOSIS — Z79899 Other long term (current) drug therapy: Secondary | ICD-10-CM | POA: Diagnosis not present

## 2021-02-23 DIAGNOSIS — Z1389 Encounter for screening for other disorder: Secondary | ICD-10-CM

## 2021-02-23 DIAGNOSIS — Z0001 Encounter for general adult medical examination with abnormal findings: Secondary | ICD-10-CM

## 2021-02-23 DIAGNOSIS — Z1329 Encounter for screening for other suspected endocrine disorder: Secondary | ICD-10-CM | POA: Diagnosis not present

## 2021-02-23 DIAGNOSIS — Z1211 Encounter for screening for malignant neoplasm of colon: Secondary | ICD-10-CM

## 2021-02-23 NOTE — Progress Notes (Signed)
AortaScan < 3 cm. Within normal limits, per Dr McKeown. 

## 2021-02-24 ENCOUNTER — Other Ambulatory Visit: Payer: Self-pay | Admitting: Internal Medicine

## 2021-02-24 DIAGNOSIS — E782 Mixed hyperlipidemia: Secondary | ICD-10-CM

## 2021-02-24 LAB — INSULIN, RANDOM: Insulin: 19.4 u[IU]/mL

## 2021-02-24 LAB — LIPID PANEL
Cholesterol: 212 mg/dL — ABNORMAL HIGH (ref <200)
HDL: 57 mg/dL (ref >=40)
LDL Cholesterol (Calc): 125 mg/dL (calc) — ABNORMAL HIGH
Non-HDL Cholesterol (Calc): 155 mg/dL (calc) — ABNORMAL HIGH (ref <130)
Total CHOL/HDL Ratio: 3.7 (calc) (ref <5.0)
Triglycerides: 183 mg/dL — ABNORMAL HIGH (ref <150)

## 2021-02-24 LAB — CBC WITH DIFFERENTIAL/PLATELET
Absolute Monocytes: 462 cells/uL (ref 200–950)
Basophils Absolute: 28 cells/uL (ref 0–200)
Basophils Relative: 0.5 %
Eosinophils Absolute: 88 cells/uL (ref 15–500)
Eosinophils Relative: 1.6 %
HCT: 41.7 % (ref 38.5–50.0)
Hemoglobin: 14 g/dL (ref 13.2–17.1)
Lymphs Abs: 1392 cells/uL (ref 850–3900)
MCH: 31.7 pg (ref 27.0–33.0)
MCHC: 33.6 g/dL (ref 32.0–36.0)
MCV: 94.3 fL (ref 80.0–100.0)
MPV: 11.4 fL (ref 7.5–12.5)
Monocytes Relative: 8.4 %
Neutro Abs: 3531 cells/uL (ref 1500–7800)
Neutrophils Relative %: 64.2 %
Platelets: 227 10*3/uL (ref 140–400)
RBC: 4.42 10*6/uL (ref 4.20–5.80)
RDW: 11.4 % (ref 11.0–15.0)
Total Lymphocyte: 25.3 %
WBC: 5.5 10*3/uL (ref 3.8–10.8)

## 2021-02-24 LAB — COMPLETE METABOLIC PANEL WITH GFR
AG Ratio: 1.7 (calc) (ref 1.0–2.5)
ALT: 15 U/L (ref 9–46)
AST: 16 U/L (ref 10–35)
Albumin: 4.1 g/dL (ref 3.6–5.1)
Alkaline phosphatase (APISO): 43 U/L (ref 35–144)
BUN: 22 mg/dL (ref 7–25)
CO2: 29 mmol/L (ref 20–32)
Calcium: 9.6 mg/dL (ref 8.6–10.3)
Chloride: 102 mmol/L (ref 98–110)
Creat: 1.32 mg/dL (ref 0.70–1.33)
GFR, Est African American: 70 mL/min/{1.73_m2} (ref >=60)
GFR, Est Non African American: 60 mL/min/{1.73_m2} (ref >=60)
Globulin: 2.4 g/dL (calc) (ref 1.9–3.7)
Glucose, Bld: 94 mg/dL (ref 65–99)
Potassium: 4.3 mmol/L (ref 3.5–5.3)
Sodium: 137 mmol/L (ref 135–146)
Total Bilirubin: 0.9 mg/dL (ref 0.2–1.2)
Total Protein: 6.5 g/dL (ref 6.1–8.1)

## 2021-02-24 LAB — URINALYSIS, ROUTINE W REFLEX MICROSCOPIC
Bilirubin Urine: NEGATIVE
Glucose, UA: NEGATIVE
Hgb urine dipstick: NEGATIVE
Ketones, ur: NEGATIVE
Leukocytes,Ua: NEGATIVE
Nitrite: NEGATIVE
Protein, ur: NEGATIVE
Specific Gravity, Urine: 1.014 (ref 1.001–1.035)
pH: 6.5 (ref 5.0–8.0)

## 2021-02-24 LAB — PSA: PSA: 0.58 ng/mL (ref <=4.00)

## 2021-02-24 LAB — TESTOSTERONE: Testosterone: 489 ng/dL (ref 250–827)

## 2021-02-24 LAB — MICROALBUMIN / CREATININE URINE RATIO
Creatinine, Urine: 81 mg/dL (ref 20–320)
Microalb, Ur: <0.2 mg/dL

## 2021-02-24 LAB — VITAMIN D 25 HYDROXY (VIT D DEFICIENCY, FRACTURES): Vit D, 25-Hydroxy: 81 ng/mL (ref 30–100)

## 2021-02-24 LAB — HEMOGLOBIN A1C
Hgb A1c MFr Bld: 5.4 % of total Hgb (ref <5.7)
Mean Plasma Glucose: 108 mg/dL
eAG (mmol/L): 6 mmol/L

## 2021-02-24 LAB — IRON, TOTAL/TOTAL IRON BINDING CAP
%SAT: 38 % (calc) (ref 20–48)
Iron: 125 ug/dL (ref 50–180)
TIBC: 330 mcg/dL (calc) (ref 250–425)

## 2021-02-24 LAB — VITAMIN B12: Vitamin B-12: 415 pg/mL (ref 200–1100)

## 2021-02-24 LAB — TSH: TSH: 1.35 mIU/L (ref 0.40–4.50)

## 2021-02-24 LAB — MAGNESIUM: Magnesium: 2.2 mg/dL (ref 1.5–2.5)

## 2021-02-24 NOTE — Progress Notes (Signed)
============================================================ - Test results slightly outside the reference range are not unusual. If there is anything important, I will review this with you,  otherwise it is considered normal test values.  If you have further questions,  please do not hesitate to contact me at the office or via My Chart.  ============================================================ ============================================================  -  Iron levels - Normal & OK  ============================================================ ============================================================  -  Vitamin B12 =   415    - Low   (Normal is between 450 - 1,100, but Ideal or Goal is between 800-1,100)  Low Vit B12 may be associated with Anemia , Fatigue,   Peripheral Neuropathy, Dementia, "Brain Fog", & Depression  - Recommend take a sub-lingual form of Vitamin B12 tablet   1,000 to 5,000 mcg tab that you dissolve under your tongue /Daily   - Can get Lavonia Dana - best price at [employer] or on [employer] ============================================================ ============================================================  -  PSA - very Low -  Great   ============================================================ ============================================================  -  Testosterone is Normal - Great  ============================================================ ============================================================  -  Total Chol = 212 - too high        (  Ideal or Goal is below 180  )   - and   - Bad /Dangerous LDL Chol = 125 - Also too high          (Ideal or Goal is below 70  !  )   -  Recommend a stricter low cholesterol diet   - Cholesterol only comes from animal sources  - ie. meat, dairy, egg yolks  - Eat all the vegetables you want.  - Avoid meat, especially red meat - Beef AND Pork .  - Avoid cheese & dairy - milk & ice cream.     -  Cheese is the most concentrated form of trans-fats which  is the worst thing to clog up our arteries.   - Veggie cheese is OK which can be found in the fresh  produce section at [employer] or [employer] or [employer] ============================================================ ============================================================  - Recommend schedule a Lab visit in about 3 months to recheck Cholesterol   ============================================================ ============================================================  -  Also Triglycerides (   183   ) or fats in blood are too high  (goal is less than 150)    - Recommend avoid fried & greasy foods,  sweets / candy,   - Avoid white rice  (brown or wild rice or Quinoa is OK),   - Avoid white potatoes  (sweet potatoes are OK)   - Avoid anything made from white flour  - bagels, doughnuts, rolls, buns, biscuits, white and   wheat breads, pizza crust and traditional  pasta made of white flour & egg white  - (vegetarian pasta or spinach or wheat pasta is OK).    - Multi-grain bread is OK - like multi-grain flat bread or  sandwich thins.   - Avoid alcohol in excess.   - Exercise is also important. ============================================================ ============================================================  -  A1c - is Staying Normal - Great - No Diabetes !  ============================================================ ============================================================  -  Vitamin D = 81 - Excellent ! ============================================================ ============================================================  -  All Else - CBC - Kidneys - U/A - Electrolytes - Liver - Magnesium & Thyroid    - all  Normal / OK ============================================================ ============================================================  -  If Chol not significantly improved, you  will likely need to start meds to   prevent complications down the road &  my biggest fear would be early age onset Dementia  from                    Cholesterol "clogging" up all of the tiny little capillaries in the brain ! ============================================================ ============================================================

## 2021-04-03 NOTE — Patient Instructions (Signed)
Benign Positional Vertigo  Vertigo is the feeling that you or your surroundings are moving when they are not. Benign positional vertigo is the most common form of vertigo. This is usually a harmless condition (benign). This condition is positional. This means that symptoms are triggered by certain movements and positions. This condition can be dangerous if it occurs while you are doing something that could cause harm to you or others. This includes activities such as driving or operating machinery. What are the causes? The inner ear has fluid-filled canals that help your brain sense movement and balance. When the fluid moves, the brain receives messages about your body's position. With benign positional vertigo, crystals in the inner ear break free and disturb the inner ear area. This causes your brain to receive confusing messages about your body's position. What increases the risk? You are more likely to develop this condition if:  You are a woman.  You are 58 years of age or older.  You have recently had a head injury.  You have an inner ear disease. What are the signs or symptoms? Symptoms of this condition usually happen when you move your head or your eyes in different directions. Symptoms may start suddenly, and usually last for less than a minute. They include:  Loss of balance and falling.  Feeling like you are spinning or moving.  Feeling like your surroundings are spinning or moving.  Nausea and vomiting.  Blurred vision.  Dizziness.  Involuntary eye movement (nystagmus). Symptoms can be mild and cause only minor problems, or they can be severe and interfere with daily life. Episodes of benign positional vertigo may return (recur) over time. Symptoms may improve over time. How is this diagnosed? This condition may be diagnosed based on:  Your medical history.  Physical exam of the head, neck, and ears.  Positional tests to check for or stimulate vertigo. You may be  asked to turn your head and change positions, such as going from sitting to lying down. A health care provider will watch for symptoms of vertigo. You may be referred to a health care provider who specializes in ear, nose, and throat problems (ENT, or otolaryngologist) or a provider who specializes in disorders of the nervous system (neurologist). How is this treated? This condition may be treated in a session in which your health care provider moves your head in specific positions to help the displaced crystals in your inner ear move. Treatment for this condition may take several sessions. Surgery may be needed in severe cases, but this is rare. In some cases, benign positional vertigo may resolve on its own in 2-4 weeks.  Follow these instructions at home: Safety  Move slowly. Avoid sudden body or head movements or certain positions, as told by your health care provider.  Avoid driving until your health care provider says it is safe for you to do so.  Avoid operating heavy machinery until your health care provider says it is safe for you to do so.  Avoid doing any tasks that would be dangerous to you or others if vertigo occurs.  If you have trouble walking or keeping your balance, try using a cane for stability. If you feel dizzy or unstable, sit down right away.  Return to your normal activities as told by your health care provider. Ask your health care provider what activities are safe for you. General instructions  Take over-the-counter and prescription medicines only as told by your health care provider.  Drink enough fluid  to keep your urine pale yellow.  Keep all follow-up visits as told by your health care provider. This is important. Contact a health care provider if:  You have a fever.  Your condition gets worse or you develop new symptoms.  Your family or friends notice any behavioral changes.  You have nausea or vomiting that gets worse.  You have numbness or a  prickling and tingling sensation. Get help right away if you:  Have difficulty speaking or moving.  Are always dizzy.  Faint.  Develop severe headaches.  Have weakness in your legs or arms.  Have changes in your hearing or vision.  Develop a stiff neck.  Develop sensitivity to light. Summary  Vertigo is the feeling that you or your surroundings are moving when they are not. Benign positional vertigo is the most common form of vertigo.  This condition is caused by crystals in the inner ear that become displaced. This causes a disturbance in an area of the inner ear that helps your brain sense movement and balance.  Symptoms include loss of balance and falling, feeling that you or your surroundings are moving, nausea and vomiting, and blurred vision.  This condition can be diagnosed based on symptoms, a physical exam, and positional tests.  Follow safety instructions as told by your health care provider. You will also be told when to contact your health care provider in case of problems.   =====================================  How to Perform the Epley Maneuver  The Epley maneuver is an exercise that relieves symptoms of vertigo. Vertigo is the feeling that you or your surroundings are moving when they are not. When you feel vertigo, you may feel like the room is spinning and may have trouble walking. The Epley maneuver is used for a type of vertigo caused by a calcium deposit in a part of the inner ear. The maneuver involves changing head positions to help the deposit move out of the area. You can do this maneuver at home whenever you have symptoms of vertigo. You can repeat it in 24 hours if your vertigo has not gone away. Even though the Epley maneuver may relieve your vertigo for a few weeks, it is possible that your symptoms will return. This maneuver relieves vertigo, but it does not relieve dizziness. What are the risks? If it is done correctly, the Epley maneuver is  considered safe. Sometimes it can lead to dizziness or nausea that goes away after a short time. If you develop other symptoms--such as changes in vision, weakness, or numbness--stop doing the maneuver and call your health care provider. Supplies needed:  A bed or table.  A pillow. How to do the Epley maneuver 1. Sit on the edge of a bed or table with your back straight and your legs extended or hanging over the edge of the bed or table. 2. Turn your head halfway toward the affected ear or side as told by your health care provider. 3. Lie backward quickly with your head turned until you are lying flat on your back. You may want to position a pillow under your shoulders. 4. Hold this position for at least 30 seconds. If you feel dizzy or have symptoms of vertigo, continue to hold the position until the symptoms stop. 5. Turn your head to the opposite direction until your unaffected ear is facing the floor. 6. Hold this position for at least 30 seconds. If you feel dizzy or have symptoms of vertigo, continue to hold the position until the  symptoms stop. 7. Turn your whole body to the same side as your head so that you are positioned on your side. Your head will now be nearly facedown. Hold for at least 30 seconds. If you feel dizzy or have symptoms of vertigo, continue to hold the position until the symptoms stop. 8. Sit back up. You can repeat the maneuver in 24 hours if your vertigo does not go away.      Follow these instructions at home: For 24 hours after doing the Epley maneuver:  Keep your head in an upright position.  When lying down to sleep or rest, keep your head raised (elevated) with two or more pillows.  Avoid excessive neck movements. Activity  Do not drive or use machinery if you feel dizzy.  After doing the Epley maneuver, return to your normal activities as told by your health care provider. Ask your health care provider what activities are safe for you. General  instructions  Drink enough fluid to keep your urine pale yellow.  Do not drink alcohol.  Take over-the-counter and prescription medicines only as told by your health care provider.  Keep all follow-up visits as told by your health care provider. This is important. Preventing vertigo symptoms Ask your health care provider if there is anything you should do at home to prevent vertigo. He or she may recommend that you:  Keep your head elevated with two or more pillows while you sleep.  Do not sleep on the side of your affected ear.  Get up slowly from bed.  Avoid sudden movements during the day.  Avoid extreme head positions or movement, such as looking up or bending over. Contact a health care provider if:  Your vertigo gets worse.  You have other symptoms, including: ? Nausea. ? Vomiting. ? Headache. Get help right away if you:  Have vision changes.  Have a headache or neck pain that is severe or getting worse.  Cannot stop vomiting.  Have new numbness or weakness in any part of your body. Summary  Vertigo is the feeling that you or your surroundings are moving when they are not.  The Epley maneuver is an exercise that relieves symptoms of vertigo.  If the Epley maneuver is done correctly, it is considered safe and relieves vertigo quickly.
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
## 2021-04-04 ENCOUNTER — Ambulatory Visit: Payer: BC Managed Care – PPO | Admitting: Internal Medicine

## 2021-04-04 ENCOUNTER — Other Ambulatory Visit: Payer: Self-pay

## 2021-04-04 ENCOUNTER — Encounter: Payer: Self-pay | Admitting: Internal Medicine

## 2021-04-04 VITALS — BP 120/80 | HR 73 | Temp 97.9°F | Resp 17 | Ht 72.0 in | Wt 199.6 lb

## 2021-04-04 DIAGNOSIS — H8110 Benign paroxysmal vertigo, unspecified ear: Secondary | ICD-10-CM

## 2021-04-04 MED ORDER — MECLIZINE HCL 25 MG PO TABS: ORAL_TABLET | ORAL | 0 refills | Status: DC

## 2021-04-04 MED ORDER — DEXAMETHASONE 4 MG PO TABS: ORAL_TABLET | ORAL | 0 refills | Status: DC

## 2021-04-20 ENCOUNTER — Other Ambulatory Visit: Payer: Self-pay | Admitting: Internal Medicine

## 2021-04-20 MED ORDER — PROMETHAZINE-DM 6.25-15 MG/5ML PO SYRP: ORAL_SOLUTION | ORAL | 1 refills | Status: DC

## 2021-04-20 MED ORDER — DEXAMETHASONE 4 MG PO TABS: ORAL_TABLET | ORAL | 0 refills | Status: DC

## 2021-04-20 MED ORDER — AZITHROMYCIN 250 MG PO TABS: ORAL_TABLET | ORAL | 1 refills | Status: DC

## 2021-08-25 ENCOUNTER — Ambulatory Visit: Payer: BC Managed Care – PPO | Admitting: Adult Health
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
## 2021-08-31 ENCOUNTER — Encounter: Payer: Self-pay | Admitting: Adult Health

## 2021-08-31 ENCOUNTER — Ambulatory Visit: Payer: BC Managed Care – PPO | Admitting: Adult Health

## 2021-08-31 ENCOUNTER — Other Ambulatory Visit: Payer: Self-pay

## 2021-08-31 VITALS — BP 118/80 | HR 57 | Temp 97.5°F | Wt 197.0 lb

## 2021-08-31 DIAGNOSIS — E663 Overweight: Secondary | ICD-10-CM

## 2021-08-31 DIAGNOSIS — E785 Hyperlipidemia, unspecified: Secondary | ICD-10-CM | POA: Diagnosis not present

## 2021-08-31 DIAGNOSIS — R7309 Other abnormal glucose: Secondary | ICD-10-CM

## 2021-08-31 DIAGNOSIS — R0989 Other specified symptoms and signs involving the circulatory and respiratory systems: Secondary | ICD-10-CM | POA: Diagnosis not present

## 2021-08-31 DIAGNOSIS — E559 Vitamin D deficiency, unspecified: Secondary | ICD-10-CM

## 2021-08-31 DIAGNOSIS — Z79899 Other long term (current) drug therapy: Secondary | ICD-10-CM

## 2021-08-31 NOTE — Patient Instructions (Signed)
Recommend low saturated fat in diet High soluble fiber (old fashioned oats, black beans, raspberries, chia seeds, etc)   Coronary Calcium Scan A coronary calcium scan is an imaging test used to look for deposits of plaque in the inner lining of the blood vessels of the heart (coronary arteries). Plaque is made up of calcium, protein, and fatty substances. These deposits of plaque can partly clog and narrow the coronary arteries without producing any symptoms or warning signs. This puts a person at risk for a heart attack. This test is recommended for people who are at moderate risk for heart disease. The test can find plaque deposits before symptoms develop. Tell a health care provider about: Any allergies you have. All medicines you are taking, including vitamins, herbs, eye drops, creams, and over-the-counter medicines. Any problems you or family members have had with anesthetic medicines. Any blood disorders you have. Any surgeries you have had. Any medical conditions you have. Whether you are pregnant or may be pregnant. What are the risks? Generally, this is a safe procedure. However, problems may occur, including: Harm to a pregnant woman and her unborn baby. This test involves the use of radiation. Radiation exposure can be dangerous to a pregnant woman and her unborn baby. If you are pregnant or think you may be pregnant, you should not have this procedure done. Slight increase in the risk of cancer. This is because of the radiation involved in the test. What happens before the procedure? Ask your health care provider for any specific instructions on how to prepare for this procedure. You may be asked to avoid products that contain caffeine, tobacco, or nicotine for 4 hours before the procedure. What happens during the procedure?  You will undress and remove any jewelry from your neck or chest. You will put on a hospital gown. Sticky electrodes will be placed on your chest. The  electrodes will be connected to an electrocardiogram (ECG) machine to record a tracing of the electrical activity of your heart. You will lie down on a curved bed that is attached to the CT scanner. You may be given medicine to slow down your heart rate so that clear pictures can be created. You will be moved into the CT scanner, and the CT scanner will take pictures of your heart. During this time, you will be asked to lie still and hold your breath for 2-3 seconds at a time while each picture of your heart is being taken. The procedure may vary among health care providers and hospitals. What happens after the procedure? You can get dressed. You can return to your normal activities. It is up to you to get the results of your procedure. Ask your health care provider, or the department that is doing the procedure, when your results will be ready. Summary A coronary calcium scan is an imaging test used to look for deposits of plaque in the inner lining of the blood vessels of the heart (coronary arteries). Plaque is made up of calcium, protein, and fatty substances. Generally, this is a safe procedure. Tell your health care provider if you are pregnant or may be pregnant. Ask your health care provider for any specific instructions on how to prepare for this procedure. A CT scanner will take pictures of your heart. You can return to your normal activities after the scan is done. This information is not intended to replace advice given to you by your health care provider. Make sure you discuss any questions you have  with your health care provider. Document Revised: 05/05/2019 Document Reviewed: 05/05/2019 Elsevier Patient Education  2022 [employer].

## 2021-09-01 ENCOUNTER — Other Ambulatory Visit: Payer: Self-pay | Admitting: Adult Health

## 2021-09-01 DIAGNOSIS — Z136 Encounter for screening for cardiovascular disorders: Secondary | ICD-10-CM

## 2021-09-01 DIAGNOSIS — E785 Hyperlipidemia, unspecified: Secondary | ICD-10-CM

## 2021-09-01 LAB — COMPLETE METABOLIC PANEL WITH GFR
AG Ratio: 1.8 (calc) (ref 1.0–2.5)
ALT: 16 U/L (ref 9–46)
AST: 18 U/L (ref 10–35)
Albumin: 4.3 g/dL (ref 3.6–5.1)
Alkaline phosphatase (APISO): 48 U/L (ref 35–144)
BUN: 19 mg/dL (ref 7–25)
CO2: 29 mmol/L (ref 20–32)
Calcium: 9.7 mg/dL (ref 8.6–10.3)
Chloride: 104 mmol/L (ref 98–110)
Creat: 1.09 mg/dL (ref 0.70–1.30)
Globulin: 2.4 g/dL (calc) (ref 1.9–3.7)
Glucose, Bld: 89 mg/dL (ref 65–99)
Potassium: 4.5 mmol/L (ref 3.5–5.3)
Sodium: 141 mmol/L (ref 135–146)
Total Bilirubin: 0.6 mg/dL (ref 0.2–1.2)
Total Protein: 6.7 g/dL (ref 6.1–8.1)
eGFR: 80 mL/min/{1.73_m2} (ref >=60)

## 2021-09-01 LAB — LIPID PANEL
Cholesterol: 205 mg/dL — ABNORMAL HIGH (ref <200)
HDL: 52 mg/dL (ref >=40)
LDL Cholesterol (Calc): 130 mg/dL (calc) — ABNORMAL HIGH
Non-HDL Cholesterol (Calc): 153 mg/dL (calc) — ABNORMAL HIGH (ref <130)
Total CHOL/HDL Ratio: 3.9 (calc) (ref <5.0)
Triglycerides: 124 mg/dL (ref <150)

## 2021-09-26 ENCOUNTER — Ambulatory Visit
Admission: RE | Admit: 2021-09-26 | Discharge: 2021-09-26 | Disposition: A | Discharge status: Home / Self Care | Payer: No Typology Code available for payment source | Source: Ambulatory Visit | Attending: Adult Health | Admitting: Adult Health

## 2021-09-26 DIAGNOSIS — E785 Hyperlipidemia, unspecified: Secondary | ICD-10-CM

## 2021-09-26 DIAGNOSIS — Z136 Encounter for screening for cardiovascular disorders: Secondary | ICD-10-CM

## 2022-02-27 ENCOUNTER — Encounter: Payer: Self-pay | Admitting: Internal Medicine

## 2022-02-27 ENCOUNTER — Ambulatory Visit (INDEPENDENT_AMBULATORY_CARE_PROVIDER_SITE_OTHER): Payer: BC Managed Care – PPO | Admitting: Internal Medicine

## 2022-02-27 VITALS — BP 122/82 | HR 70 | Temp 97.9°F | Resp 16 | Ht 72.0 in | Wt 192.4 lb

## 2022-02-27 DIAGNOSIS — I700 Atherosclerosis of aorta: Secondary | ICD-10-CM

## 2022-02-27 DIAGNOSIS — N401 Enlarged prostate with lower urinary tract symptoms: Secondary | ICD-10-CM | POA: Diagnosis not present

## 2022-02-27 DIAGNOSIS — E559 Vitamin D deficiency, unspecified: Secondary | ICD-10-CM | POA: Diagnosis not present

## 2022-02-27 DIAGNOSIS — Z79899 Other long term (current) drug therapy: Secondary | ICD-10-CM | POA: Diagnosis not present

## 2022-02-27 DIAGNOSIS — Z131 Encounter for screening for diabetes mellitus: Secondary | ICD-10-CM

## 2022-02-27 DIAGNOSIS — E782 Mixed hyperlipidemia: Secondary | ICD-10-CM

## 2022-02-27 DIAGNOSIS — R350 Frequency of micturition: Secondary | ICD-10-CM | POA: Diagnosis not present

## 2022-02-27 DIAGNOSIS — R5383 Other fatigue: Secondary | ICD-10-CM

## 2022-02-27 DIAGNOSIS — Z125 Encounter for screening for malignant neoplasm of prostate: Secondary | ICD-10-CM | POA: Diagnosis not present

## 2022-02-27 DIAGNOSIS — Z1211 Encounter for screening for malignant neoplasm of colon: Secondary | ICD-10-CM

## 2022-02-27 DIAGNOSIS — Z1329 Encounter for screening for other suspected endocrine disorder: Secondary | ICD-10-CM | POA: Diagnosis not present

## 2022-02-27 DIAGNOSIS — Z130 Encounter for screening for diseases of the blood and blood-forming organs and certain disorders involving the immune mechanism: Secondary | ICD-10-CM | POA: Diagnosis not present

## 2022-02-27 DIAGNOSIS — Z13220 Encounter for screening for lipoid disorders: Secondary | ICD-10-CM | POA: Diagnosis not present

## 2022-02-27 DIAGNOSIS — Z136 Encounter for screening for cardiovascular disorders: Secondary | ICD-10-CM | POA: Diagnosis not present

## 2022-02-27 DIAGNOSIS — Z1389 Encounter for screening for other disorder: Secondary | ICD-10-CM

## 2022-02-27 DIAGNOSIS — R0989 Other specified symptoms and signs involving the circulatory and respiratory systems: Secondary | ICD-10-CM

## 2022-02-27 DIAGNOSIS — Z111 Encounter for screening for respiratory tuberculosis: Secondary | ICD-10-CM | POA: Diagnosis not present

## 2022-02-27 DIAGNOSIS — Z0001 Encounter for general adult medical examination with abnormal findings: Secondary | ICD-10-CM

## 2022-02-27 DIAGNOSIS — Z0000 Encounter for general adult medical examination without abnormal findings: Secondary | ICD-10-CM | POA: Diagnosis not present

## 2022-02-27 DIAGNOSIS — R7309 Other abnormal glucose: Secondary | ICD-10-CM

## 2022-02-27 NOTE — Patient Instructions (Signed)
Due to recent changes in healthcare laws, you may see the results of your imaging and laboratory studies on MyChart before your provider has had a chance to review them.  We understand that in some cases there may be results that are confusing or concerning to you. Not all laboratory results come back in the same time frame and the provider may be waiting for multiple results in order to interpret others.  Please give us 48 hours in order for your provider to thoroughly review all the results before contacting the office for clarification of your results.  ? ?++++++++++++++++++++++++++++++++++++++ ? Vit D  & ?Vit C 1,000 mg   ?are recommended to help protect  ?against the Covid-19 and other Corona viruses.  ? ? Also it's recommended  ?to take  ?Zinc 50 mg  ?to help  ?protect against the Covid-19   ?and best place to get ? is also on [employer]  ?and don't pay more than 6-8 cents /pill !  ?=============================== ?Coronavirus (COVID-19) Are you at risk? ? ?Are you at risk for the Coronavirus (COVID-19)? ? ?To be considered HIGH RISK for Coronavirus (COVID-19), you have to meet the following criteria: ? ?Traveled to [place], [place], [place], [place] or [place]; or in the [place] to [place], [place], [place]  ?or [place]; and have fever, cough, and shortness of breath within the last 2 weeks of travel OR ?Been in close contact with a person diagnosed with COVID-19 within the last 2 weeks and have  ?fever, cough,and shortness of breath ? ?IF YOU DO NOT MEET THESE CRITERIA, YOU ARE CONSIDERED LOW RISK FOR COVID-19. ? ?What to do if you are HIGH RISK for COVID-19? ? ?If you are having a medical emergency, call 911. ?Seek medical care right away. Before you go to a doctor?s office, urgent care or emergency department, ? call ahead and tell them about your recent travel, contact with someone diagnosed with COVID-19  ? and your symptoms.  ?You should receive instructions from your physician?s office  regarding next steps of care.  ?When you arrive at healthcare provider, tell the healthcare staff immediately you have returned from  ?visiting [place], [place], [place], [place] or [place]; or traveled in the [place] to [place], [place],  ?[place] or [place] in the last two weeks or you have been in close contact with a person diagnosed with  ?COVID-19 in the last 2 weeks.   ?Tell the health care staff about your symptoms: fever, cough and shortness of breath. ?After you have been seen by a medical provider, you will be either: ?Tested for (COVID-19) and discharged home on quarantine except to seek medical care if  ?symptoms worsen, and asked to  ?Stay home and avoid contact with others until you get your results (4-5 days)  ?Avoid travel on public transportation if possible (such as bus, train, or airplane) or ?Sent to the Emergency Department by EMS for evaluation, COVID-19 testing  and  ?possible admission depending on your condition and test results. ? ?What to do if you are LOW RISK for COVID-19? ? ?Reduce your risk of any infection by using the same precautions used for avoiding the common cold or flu:  ?Wash your hands often with soap and warm water for at least 20 seconds.  If soap and water are not readily available,  ?use an alcohol-based hand sanitizer with at least 60% alcohol.  ?If coughing or sneezing, cover your mouth and nose by coughing   or sneezing into the elbow areas of your shirt or coat, ? into a tissue or into your sleeve (not your hands). ?Avoid shaking hands with others and consider head nods or verbal greetings only. ?Avoid touching your eyes, nose, or mouth with unwashed hands.  ?Avoid close contact with people who are sick. ?Avoid places or events with large numbers of people in one location, like concerts or sporting events. ?Carefully consider travel plans you have or are making. ?If you are planning any travel outside or inside the [place], visit the CDC?s Travelers? Health  webpage for the latest health notices. ?If you have some symptoms but not all symptoms, continue to monitor at home and seek medical attention  ?if your symptoms worsen. ?If you are having a medical emergency, call 911. ?>>>>>>>>>>>>>>>>>>>>>>>>>>>> ?Preventive Care for Adults ? ?A healthy lifestyle and preventive care can promote health and wellness. Preventive health guidelines for men include the following key practices: ?A routine yearly physical is a good way to check with your health care provider about your health and preventative screening. It is a chance to share any concerns and updates on your health and to receive a thorough exam. ?Visit your dentist for a routine exam and preventative care every 6 months. Brush your teeth twice a day and floss once a day. Good oral hygiene prevents tooth decay and gum disease. ?The frequency of eye exams is based on your age, health, family medical history, use of contact lenses, and other factors. Follow your health care provider's recommendations for frequency of eye exams. ?Eat a healthy diet. Foods such as vegetables, fruits, whole grains, low-fat dairy products, and lean protein foods contain the nutrients you need without too many calories. Decrease your intake of foods high in solid fats, added sugars, and salt. Eat the right amount of calories for you. Get information about a proper diet from your health care provider, if necessary. ?Regular physical exercise is one of the most important things you can do for your health. Most adults should get at least 150 minutes of moderate-intensity exercise (any activity that increases your heart rate and causes you to sweat) each week. In addition, most adults need muscle-strengthening exercises on 2 or more days a week. ?Maintain a healthy weight. The body mass index (BMI) is a screening tool to identify possible weight problems. It provides an estimate of body fat based on height and weight. Your health care provider can  find your BMI and can help you achieve or maintain a healthy weight. For adults 20 years and older: ?A BMI below 18.5 is considered underweight. ?A BMI of 18.5 to 24.9 is normal. ?A BMI of 25 to 29.9 is considered overweight. ?A BMI of 30 and above is considered obese. ?Maintain normal blood lipids and cholesterol levels by exercising and minimizing your intake of saturated fat. Eat a balanced diet with plenty of fruit and vegetables. Blood tests for lipids and cholesterol should begin at age 20 and be repeated every 5 years. If your lipid or cholesterol levels are high, you are over 50, or you are at high risk for heart disease, you may need your cholesterol levels checked more frequently. Ongoing high lipid and cholesterol levels should be treated with medicines if diet and exercise are not working. ?If you smoke, find out from your health care provider how to quit. If you do not use tobacco, do not start. ?Lung cancer screening is recommended for adults aged 55-80 years who are at high risk for   developing lung cancer because of a history of smoking. A yearly low-dose CT scan of the lungs is recommended for people who have at least a 30-pack-year history of smoking and are a current smoker or have quit within the past 15 years. A pack year of smoking is smoking an average of 1 pack of cigarettes a day for 1 year (for example: 1 pack a day for 30 years or 2 packs a day for 15 years). Yearly screening should continue until the smoker has stopped smoking for at least 15 years. Yearly screening should be stopped for people who develop a health problem that would prevent them from having lung cancer treatment. ?If you choose to drink alcohol, do not have more than 2 drinks per day. One drink is considered to be 12 ounces (355 mL) of beer, 5 ounces (148 mL) of wine, or 1.5 ounces (44 mL) of liquor. ?Avoid use of street drugs. Do not share needles with anyone. Ask for help if you need support or instructions about  stopping the use of drugs. ?High blood pressure causes heart disease and increases the risk of stroke. Your blood pressure should be checked at least every 1-2 years. Ongoing high blood pressure should be treated

## 2022-02-27 NOTE — Progress Notes (Signed)
? ?Annual  Screening/Preventative Visit  ?& Comprehensive Evaluation & Examination ? ?Future Appointments  ?Date Time Provider Department  ?02/27/2022  2:00 PM Lucky CowboyMcKeown, Delesia Martinek, MD [facility]  ?03/04/2023  2:00 PM Lucky CowboyMcKeown, Marieann Zipp, MD [facility]  ? ?    ?     This very nice 56 y.o. MWM presents for a Screening /Preventative Visit & comprehensive evaluation and management of multiple medical co-morbidities.  Patient has been followed for labile HTN, HLD, Prediabetes and Vitamin D Deficiency. ? ? ?    Labile HTN predates since  2006.  Patient's BP has been controlled and today's BP is at goal - 122/82. Patient denies any cardiac symptoms as chest pain, palpitations, shortness of breath, dizziness or ankle swelling. ? ? ?    Patient's hyperlipidemia is not controlled with diet and medications. Patient denies myalgias or other medication SE's. Last lipids were not at goal : ? ?Lab Results  ?Component Value Date  ? CHOL 205 (H) 08/31/2021  ? HDL 52 08/31/2021  ? LDLCALC 130 (H) 08/31/2021  ? TRIG 124 08/31/2021  ? CHOLHDL 3.9 08/31/2021  ? ? ? ?    Patient has hx/o prediabetes (A1c 5.9% /2015) and patient denies reactive hypoglycemic symptoms, visual blurring, diabetic polys or paresthesias. Last A1c was normal & at goal : ?  ?Lab Results  ?Component Value Date  ? HGBA1C 5.4 02/23/2021  ?  ? ? ?    Finally, patient has history of Vitamin D Deficiency ("27" /2008) and last vitamin D was at goal : ?  ?Lab Results  ?Component Value Date  ? VD25OH 81 02/23/2021  ? ? ? ?Current Outpatient Medications on File Prior to Visit  ?Medication Sig  ? aspirin EC 81 MG tablet Take  daily.  ? VITAMIN D Take 10,000 Units daily.   ? loratadine 10 MG tablet Take   daily.  ? Multiple Vitamins-Minerals  Take   daily.  ? ?No Known Allergies ? ? ?Past Medical History:  ?Diagnosis Date  ? Allergic rhinitis   ? Elevated lipids   ? Labile hypertension   ? Vitamin D deficiency   ? ? ?Health Maintenance  ?Topic Date Due  ? COVID-19 Vaccine (3 - Pfizer  risk series) 08/14/2021  ? INFLUENZA VACCINE  05/29/2022  ? TETANUS/TDAP  10/04/2025  ? Hepatitis C Screening  Completed  ? HIV Screening  Completed  ? Zoster Vaccines- Shingrix  Completed  ? HPV VACCINES  Aged Out  ? ? ?Immunization History  ?Administered Date(s) Administered  ? Influenza Inj Mdck Quad  07/19/2020  ? Influenza Split 09/20/2014  ? Influenza, Quadrivalent, Recombinant 07/13/2019  ? Influenza, Seasonal 10/05/2015  ? Influenza 08/03/2017, 09/12/2018, 07/17/2021  ? Moderna SARS-COV2 Booster Vacc 07/17/2021  ? PFIZER-SARS-COV-2 Vacc 12/31/2019, 01/26/2020  ? PPD Test 01/06/2018, 01/22/2019, 02/22/2020, 02/23/2021  ? Tdap 10/05/2015  ? Zoster Recombinat (Shingrix) 09/12/2018, 11/21/2018  ? ?Last Colon - 01/08/2018 - Dr Kerin SalenArya Karki  [facility] - recc 5 yr f/u due Mar 2024 ? ?Past Surgical History:  ?Procedure Laterality Date  ? ANTERIOR CRUCIATE LIGAMENT REPAIR Left 1986  ? ORIF ANKLE FRACTURE Right 2007  ? ? ?Family History  ?Problem Relation Age of Onset  ? Depression Mother   ? Cancer Father   ?     colon  ? ? ? ?Social History  ? ?Socioeconomic History  ? Marital status: Married  ?    Spouse name: Lynden AngCathy  ? Number of children: 2 sons - [id number] & 57 yo. No  Grandchildren.  ?Occupational History  ?   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  ? ?Tobacco Use  ? Smoking status: Never  ? Smokeless tobacco: Never  ?Substance Use Topics  ? Alcohol use: Yes  ?  Alcohol/week: 0.0 standard drinks  ?  Comment: Social  ? Drug use: No  ? ? ? ? ROS ?Constitutional: Denies fever, chills, weight loss/gain, headaches, insomnia,  night sweats or change in appetite. Does c/o fatigue. ?Eyes: Denies redness, blurred vision, diplopia, discharge, itchy or watery eyes.  ?ENT: Denies discharge, congestion, post nasal drip, epistaxis, sore throat, earache, hearing loss, dental pain, Tinnitus, Vertigo, Sinus pain or snoring.  ?Cardio: Denies chest pain, palpitations, irregular heartbeat, syncope, dyspnea, diaphoresis, orthopnea, PND,  claudication or edema ?Respiratory: denies cough, dyspnea, DOE, pleurisy, hoarseness, laryngitis or wheezing.  ?Gastrointestinal: Denies dysphagia, heartburn, reflux, water brash, pain, cramps, nausea, vomiting, bloating, diarrhea, constipation, hematemesis, melena, hematochezia, jaundice or hemorrhoids ?Genitourinary: Denies dysuria, frequency, urgency, nocturia, hesitancy, discharge, hematuria or flank pain ?Musculoskeletal: Denies arthralgia, myalgia, stiffness, Jt. Swelling, pain, limp or strain/sprain. Denies Falls. ?Skin: Denies puritis, rash, hives, warts, acne, eczema or change in skin lesion ?Neuro: No weakness, tremor, incoordination, spasms, paresthesia or pain ?Psychiatric: Denies confusion, memory loss or sensory loss. Denies Depression. ?Endocrine: Denies change in weight, skin, hair change, nocturia, and paresthesia, diabetic polys, visual blurring or hyper / hypo glycemic episodes.  ?Heme/Lymph: No excessive bleeding, bruising or enlarged lymph nodes. ? ? ?Physical Exam ? ?BP 122/82   Pulse 70   Temp 97.9 ?F (36.6 ?C)   Resp 16   Ht 6' (1.829 m)   Wt 192 lb 6.4 oz (87.3 kg)   SpO2 96%   BMI 26.09 kg/m?  ? ?General Appearance: Well nourished and well groomed and in no apparent distress.  ? ?Eyes: PERRLA, EOMs, conjunctiva no swelling or erythema, normal fundi and vessels. ?Sinuses: No frontal/maxillary tenderness ?ENT/Mouth: EACs patent / TMs  nl. Nares clear without erythema, swelling, mucoid exudates. Oral hygiene is good. No erythema, swelling, or exudate. Tongue normal, non-obstructing. Tonsils not swollen or erythematous. Hearing normal.  ?Neck: Supple, thyroid not palpable. No bruits, nodes or JVD. ?Respiratory: Respiratory effort normal.  BS equal and clear bilateral without rales, rhonci, wheezing or stridor. ?Cardio: Heart sounds are normal with regular rate and rhythm and no murmurs, rubs or gallops. Peripheral pulses are normal and equal bilaterally without edema. No aortic or  femoral bruits. ?Chest: symmetric with normal excursions and percussion.  ?Abdomen: Soft, with Nl bowel sounds. Nontender, no guarding, rebound, hernias, masses, or organomegaly.  ?Lymphatics: Non tender without lymphadenopathy.  ?Musculoskeletal: Full ROM all peripheral extremities, joint stability, 5/5 strength, and normal gait. ?Skin: Warm and dry without rashes, lesions, cyanosis, clubbing or  ecchymosis.  ?Neuro: Cranial nerves intact, reflexes equal bilaterally. Normal muscle tone, no cerebellar symptoms. Sensation intact.  ?Pysch: Alert and oriented X 3 with normal affect, insight and judgment appropriate.  ? ?Assessment and Plan ? ?1. Annual Preventative/Screening Exam  ? ? ?2. Labile hypertension ? ?- EKG 12-Lead ?- [place], RETROPERITNL ABD,  LTD ?- Urinalysis, Routine w reflex microscopic ?- Microalbumin / creatinine urine ratio ?- CBC with Differential/Platelet ?- COMPLETE METABOLIC PANEL WITH GFR ?- Magnesium ?- TSH ? ?3. Hyperlipidemia, mixed ? ?- EKG 12-Lead ?- [place], RETROPERITNL ABD,  LTD ?- Lipid panel ?- TSH ? ?4. Abnormal glucose ? ?- EKG 12-Lead ?- [place], RETROPERITNL ABD,  LTD ?- Hemoglobin A1c ?- Insulin, random ? ?5. Vitamin D deficiency ? ?- VITAMIN D  25 Hydroxy  ? ?6. Screening for colorectal cancer ? ?- POC Hemoccult Bld/Stl  ? ?7. Screening-pulmonary TB ? ?- TB Skin Test ? ?8. Prostate cancer screening ? ?- PSA ? ?9. Screening for heart disease ? ?- EKG 12-Lead ? ?10. Screening for AAA (aortic abdominal aneurysm) ? ?- [place], RETROPERITNL ABD,  LTD ? ?11. Fatigue, unspecified type ? ?- Iron, Total/Total Iron Binding Cap ?- Vitamin B12 ?- Testosterone ?- CBC with Differential/Platelet ?- TSH ? ?12. Medication management ? ?- Urinalysis, Routine w reflex microscopic ?- Microalbumin / creatinine urine ratio ?- CBC with Differential/Platelet ?- COMPLETE METABOLIC PANEL WITH GFR ?- Magnesium ?- Lipid panel ?- TSH ?- Hemoglobin A1c ?- Insulin, random ?- VITAMIN D 25 Hydroxy  ? ? ?     Patient was counseled in  prudent diet, weight control to achieve/maintain BMI less than 25, BP monitoring, regular exercise and medications as discussed.  Discussed med effects and SE's. Routine screening labs and tests as requested with regular

## 2022-02-28 ENCOUNTER — Other Ambulatory Visit: Payer: Self-pay | Admitting: Internal Medicine

## 2022-02-28 DIAGNOSIS — Z79899 Other long term (current) drug therapy: Secondary | ICD-10-CM

## 2022-02-28 DIAGNOSIS — E538 Deficiency of other specified B group vitamins: Secondary | ICD-10-CM

## 2022-02-28 DIAGNOSIS — E782 Mixed hyperlipidemia: Secondary | ICD-10-CM

## 2022-02-28 LAB — HEMOGLOBIN A1C
Hgb A1c MFr Bld: 5.5 % of total Hgb (ref <5.7)
Mean Plasma Glucose: 111 mg/dL
eAG (mmol/L): 6.2 mmol/L

## 2022-02-28 LAB — COMPLETE METABOLIC PANEL WITH GFR
AG Ratio: 1.9 (calc) (ref 1.0–2.5)
ALT: 13 U/L (ref 9–46)
AST: 15 U/L (ref 10–35)
Albumin: 4.5 g/dL (ref 3.6–5.1)
Alkaline phosphatase (APISO): 48 U/L (ref 35–144)
BUN: 24 mg/dL (ref 7–25)
CO2: 30 mmol/L (ref 20–32)
Calcium: 9.9 mg/dL (ref 8.6–10.3)
Chloride: 101 mmol/L (ref 98–110)
Creat: 1.04 mg/dL (ref 0.70–1.30)
Globulin: 2.4 g/dL (calc) (ref 1.9–3.7)
Glucose, Bld: 91 mg/dL (ref 65–99)
Potassium: 4.6 mmol/L (ref 3.5–5.3)
Sodium: 139 mmol/L (ref 135–146)
Total Bilirubin: 0.7 mg/dL (ref 0.2–1.2)
Total Protein: 6.9 g/dL (ref 6.1–8.1)
eGFR: 84 mL/min/{1.73_m2} (ref >=60)

## 2022-02-28 LAB — CBC WITH DIFFERENTIAL/PLATELET
Absolute Monocytes: 448 cells/uL (ref 200–950)
Basophils Absolute: 41 cells/uL (ref 0–200)
Basophils Relative: 0.7 %
Eosinophils Absolute: 112 cells/uL (ref 15–500)
Eosinophils Relative: 1.9 %
HCT: 43.4 % (ref 38.5–50.0)
Hemoglobin: 14.9 g/dL (ref 13.2–17.1)
Lymphs Abs: 1746 cells/uL (ref 850–3900)
MCH: 32.4 pg (ref 27.0–33.0)
MCHC: 34.3 g/dL (ref 32.0–36.0)
MCV: 94.3 fL (ref 80.0–100.0)
MPV: 11.7 fL (ref 7.5–12.5)
Monocytes Relative: 7.6 %
Neutro Abs: 3552 cells/uL (ref 1500–7800)
Neutrophils Relative %: 60.2 %
Platelets: 244 10*3/uL (ref 140–400)
RBC: 4.6 10*6/uL (ref 4.20–5.80)
RDW: 11.4 % (ref 11.0–15.0)
Total Lymphocyte: 29.6 %
WBC: 5.9 10*3/uL (ref 3.8–10.8)

## 2022-02-28 LAB — LIPID PANEL
Cholesterol: 226 mg/dL — ABNORMAL HIGH (ref <200)
HDL: 61 mg/dL (ref >=40)
LDL Cholesterol (Calc): 134 mg/dL (calc) — ABNORMAL HIGH
Non-HDL Cholesterol (Calc): 165 mg/dL (calc) — ABNORMAL HIGH (ref <130)
Total CHOL/HDL Ratio: 3.7 (calc) (ref <5.0)
Triglycerides: 179 mg/dL — ABNORMAL HIGH (ref <150)

## 2022-02-28 LAB — MICROALBUMIN / CREATININE URINE RATIO
Creatinine, Urine: 44 mg/dL (ref 20–320)
Microalb, Ur: <0.2 mg/dL

## 2022-02-28 LAB — IRON, TOTAL/TOTAL IRON BINDING CAP
%SAT: 33 % (calc) (ref 20–48)
Iron: 124 ug/dL (ref 50–180)
TIBC: 373 mcg/dL (calc) (ref 250–425)

## 2022-02-28 LAB — TSH: TSH: 1.69 mIU/L (ref 0.40–4.50)

## 2022-02-28 LAB — URINALYSIS, ROUTINE W REFLEX MICROSCOPIC
Bilirubin Urine: NEGATIVE
Glucose, UA: NEGATIVE
Hgb urine dipstick: NEGATIVE
Ketones, ur: NEGATIVE
Leukocytes,Ua: NEGATIVE
Nitrite: NEGATIVE
Protein, ur: NEGATIVE
Specific Gravity, Urine: 1.011 (ref 1.001–1.035)
pH: 7 (ref 5.0–8.0)

## 2022-02-28 LAB — MAGNESIUM: Magnesium: 2.3 mg/dL (ref 1.5–2.5)

## 2022-02-28 LAB — VITAMIN D 25 HYDROXY (VIT D DEFICIENCY, FRACTURES): Vit D, 25-Hydroxy: 95 ng/mL (ref 30–100)

## 2022-02-28 LAB — INSULIN, RANDOM: Insulin: 5.4 u[IU]/mL

## 2022-02-28 LAB — PSA: PSA: 0.67 ng/mL (ref <=4.00)

## 2022-02-28 LAB — TESTOSTERONE: Testosterone: 658 ng/dL (ref 250–827)

## 2022-02-28 LAB — VITAMIN B12: Vitamin B-12: 411 pg/mL (ref 200–1100)

## 2022-02-28 MED ORDER — ROSUVASTATIN CALCIUM 20 MG PO TABS: ORAL_TABLET | ORAL | 3 refills | Status: DC

## 2022-02-28 NOTE — Progress Notes (Signed)
<><><><><><><><><><><><><><><><><><><><><><><><><><><><><><><><><> ?<><><><><><><><><><><><><><><><><><><><><><><><><><><><><><><><><> ?-   Test results slightly outside the reference range are not unusual. ?If there is anything important, I will review this with you,  ?otherwise it is considered normal test values.  ?If you have further questions,  ?please do not hesitate to contact me at the office or via My Chart.  ?<><><><><><><><><><><><><><><><><><><><><><><><><><><><><><><><><> ? ?-  Total  Chol =   226    - Elevated  ?           (  Ideal  or  Goal is less than 180  !  )  ?- and  ? ?-  Bad / Dangerous LDL  Chol = 134   - also Elevated  ?            (  Ideal  or  Goal is less than 70  !  )  ? ?- Time to start meds !  Rx sent to your [facility] ? ?- Diet is still very Important  ? ?- Cholesterol only comes from animal sources  ?- ie. meat, dairy, egg yolks ? ?- Eat all the vegetables you want. ? ?- Avoid Meat, Avoid Meat , Avoid Meat - especially red meat - Beef AND Pork . ? ?- Avoid cheese & dairy - milk & ice cream.    ? ?- Cheese is the most concentrated form of trans-fats which  ?is the worst thing to clog up our arteries.  ? ?- Veggie cheese is OK which can be found in the fresh  ?produce section at [facility] or [employer] or [employer] ?<><><><><><><><><><><><><><><><><><><><><><><><><><><><><><><><><> ? ?-  Iron level is Normal / OK  ?<><><><><><><><><><><><><><><><><><><><><><><><><><><><><><><><><> ? ?-   ?-  Vitamin B12 =    411 -   Very Low  ?(Ideal or Goal Vit B12 is between 450 - 1,100)  ? ?Low Vit B12 may be associated with Anemia , Fatigue, Impotence,  ? ?                                       Peripheral Neuropathy, Dementia, "Brain Fog"   & Depression ? ?- Recommend take a sub-lingual form of Vitamin B12 tablet  ? ?                                1,000 to 5,000 mcg tab that you dissolve under your tongue /Daily  ? ?- Can get Baron Sane - best price at [employer] or on  [employer] ?<><><><><><><><><><><><><><><><><><><><><><><><><><><><><><><><><> ? ?-  PSA - Very Low   - Great  ?<><><><><><><><><><><><><><><><><><><><><><><><><><><><><><><><><> ? ?-  A1c - Normal - No Diabetes   - Great ! ?<><><><><><><><><><><><><><><><><><><><><><><><><><><><><><><><><> ? ?-  Vitamin D = 95 - Excellent - Please continue dose same  ?<><><><><><><><><><><><><><><><><><><><><><><><><><><><><><><><><> ? ?-  All Else - CBC - Kidneys - Electrolytes - Liver - Magnesium & Thyroid   ? ?- all  Normal / OK ?<><><><><><><><><><><><><><><><><><><><><><><><><><><><><><><><><> ?<><><><><><><><><><><><><><><><><><><><><><><><><><><><><><><><><> ? ?- Please schedule a nurse visit in 3 months to recheck Cholesterol  ?<><><><><><><><><><><><><><><><><><><><><><><><><><><><><><><><><> ?<><><><><><><><><><><><><><><><><><><><><><><><><><><><><><><><><> ? ? ?- ? ? ? ? ? ? ? ? ? ? ? ? ? ? ?

## 2022-05-23 ENCOUNTER — Encounter: Payer: Self-pay | Admitting: Internal Medicine

## 2022-09-05 NOTE — Progress Notes (Unsigned)
FOLLOW UP  Assessment and Plan:   Hypertension Well controlled by lifestyhle Monitor blood pressure at home; patient to call if consistently greater than 130/80 Continue DASH diet.   Reminder to go to the ER if any CP, SOB, nausea, dizziness, severe HA, changes vision/speech, left arm numbness and tingling and jaw pain.  Cholesterol Currently on Rosuvastatin 20 mg QD LDL goal <100 Continue low cholesterol diet and exercise.  Check lipid panel.   Abnormal glucose Recent A1Cs at goal Discussed diet/exercise, weight management  Defer A1C; check CMP  Overweight - BMI 26 Long discussion about weight loss, diet, and exercise Recommended diet heavy in fruits and veggies and low in animal meats, cheeses, and dairy products, appropriate calorie intake Will follow up in 6 months  Vitamin D Def At goal at last visit; continue supplementation to maintain goal of 60-100 Defer Vit D level  Medication Management - CBC - CMP - LIPID PANEL - Magnesium  Continue diet and meds as discussed. Further disposition pending results of labs. Discussed med's effects and SE's.   Over 30 minutes of exam, counseling, chart review, and critical decision making was performed.   Future Appointments  Date Time Provider Department Center  03/04/2023  2:00 PM [name], MD [facility] None    ----------------------------------------------------------------------------------------------------------------------  HPI 56 y.o. male  presents for 6 month follow up on BP, cholesterol, glucose, weight and vitamin D deficiency.    BMI is Body mass index is 26.56 kg/m., he has been working on diet and exercise. He lifts weights, walks dogs, rides mountain bike.  Reports limited fast food, eats lots of salads, bakes protein. Wt Readings from Last 3 Encounters:  09/06/22 195 lb 12.8 oz (88.8 kg)  02/27/22 192 lb 6.4 oz (87.3 kg)  08/31/21 197 lb (89.4 kg)    His BP has been well controlled., today  their BP is BP: 122/72  BP Readings from Last 3 Encounters:  09/06/22 122/72  02/27/22 122/82  08/31/21 118/80   He does workout. He denies chest pain, shortness of breath, dizziness.    He is on cholesterol medication, Rosuvastatin 20 mg QD and denies myalgias. Medication was started after last lipid result in 02/2022.  He denies family hx of MI/CVA. The cholesterol last visit was:   Lab Results  Component Value Date   CHOL 226 (H) 02/27/2022   HDL 61 02/27/2022   LDLCALC 134 (H) 02/27/2022   TRIG 179 (H) 02/27/2022   CHOLHDL 3.7 02/27/2022    He has been working on diet and exercise for glucose management, and denies nausea, paresthesia of the feet, polydipsia, polyuria, and visual disturbances. Last A1C in the office was:  Lab Results  Component Value Date   HGBA1C 5.5 02/27/2022   Patient is on Vitamin D supplement.   Lab Results  Component Value Date   VD25OH 95 02/27/2022        Current Medications:  Current Outpatient Medications on File Prior to Visit  Medication Sig   aspirin EC 81 MG tablet Take 81 mg by mouth daily.   Cholecalciferol (VITAMIN D PO) Take 10,000 Units by mouth daily.    loratadine (CLARITIN) 10 MG tablet Take 10 mg by mouth daily.   Multiple Vitamins-Minerals (MULTIVITAMIN PO) Take by mouth daily.   rosuvastatin (CRESTOR) 20 MG tablet Take  1 tablet  Daily  for Cholesterol   No current facility-administered medications on file prior to visit.     Allergies: No Known Allergies   Medical History:  Past Medical History:  Diagnosis Date   Allergic rhinitis    Elevated lipids    Labile hypertension    Vitamin D deficiency    Family history- Reviewed and unchanged Social history- Reviewed and unchanged   Review of Systems:  Review of Systems  Constitutional:  Negative for malaise/fatigue and weight loss.  HENT:  Negative for hearing loss and tinnitus.   Eyes:  Negative for blurred vision and double vision.  Respiratory:  Negative for  cough, shortness of breath and wheezing.   Cardiovascular:  Negative for chest pain, palpitations, orthopnea, claudication and leg swelling.  Gastrointestinal:  Negative for abdominal pain, blood in stool, constipation, diarrhea, heartburn, melena, nausea and vomiting.  Genitourinary: Negative.   Musculoskeletal:  Negative for joint pain and myalgias.  Skin:  Negative for rash.  Neurological:  Negative for dizziness, tingling, sensory change, weakness and headaches.  Endo/Heme/Allergies:  Negative for polydipsia.  Psychiatric/Behavioral: Negative.    All other systems reviewed and are negative.     Physical Exam: BP 122/72   Pulse 62   Temp (!) 97.4 F (36.3 C)   Wt 195 lb 12.8 oz (88.8 kg)   SpO2 97%   BMI 26.56 kg/m  Wt Readings from Last 3 Encounters:  09/06/22 195 lb 12.8 oz (88.8 kg)  02/27/22 192 lb 6.4 oz (87.3 kg)  08/31/21 197 lb (89.4 kg)   General Appearance: Well nourished, in no apparent distress. Eyes: PERRLA, EOMs, conjunctiva no swelling or erythema Sinuses: No Frontal/maxillary tenderness ENT/Mouth: Ext aud canals clear, TMs without erythema, bulging. No erythema, swelling, or exudate on post pharynx.  Tonsils not swollen or erythematous. Hearing normal.  Neck: Supple, thyroid normal.  Respiratory: Respiratory effort normal, BS equal bilaterally without rales, rhonchi, wheezing or stridor.  Cardio: RRR with no MRGs. Brisk peripheral pulses without edema.  Abdomen: Soft, + BS.  Non tender, no guarding, rebound, hernias, masses. Lymphatics: Non tender without lymphadenopathy.  Musculoskeletal: Full ROM, 5/5 strength, Normal gait Skin: Warm, dry without rashes, lesions, ecchymosis.  Neuro: Cranial nerves intact. No cerebellar symptoms.  Psych: Awake and oriented X 3, normal affect, Insight and Judgment appropriate.    Raynelle Dick, NP 4:09 PM [facility]

## 2022-09-06 ENCOUNTER — Ambulatory Visit: Payer: BC Managed Care – PPO | Admitting: Nurse Practitioner

## 2022-09-06 ENCOUNTER — Ambulatory Visit: Payer: BC Managed Care – PPO | Admitting: Adult Health

## 2022-09-06 ENCOUNTER — Encounter: Payer: Self-pay | Admitting: Nurse Practitioner

## 2022-09-06 VITALS — BP 122/72 | HR 62 | Temp 97.4°F | Wt 195.8 lb

## 2022-09-06 DIAGNOSIS — E663 Overweight: Secondary | ICD-10-CM

## 2022-09-06 DIAGNOSIS — E782 Mixed hyperlipidemia: Secondary | ICD-10-CM | POA: Diagnosis not present

## 2022-09-06 DIAGNOSIS — R0989 Other specified symptoms and signs involving the circulatory and respiratory systems: Secondary | ICD-10-CM | POA: Diagnosis not present

## 2022-09-06 DIAGNOSIS — R7309 Other abnormal glucose: Secondary | ICD-10-CM

## 2022-09-06 DIAGNOSIS — E559 Vitamin D deficiency, unspecified: Secondary | ICD-10-CM | POA: Diagnosis not present

## 2022-09-06 DIAGNOSIS — Z79899 Other long term (current) drug therapy: Secondary | ICD-10-CM | POA: Diagnosis not present

## 2022-09-07 LAB — COMPLETE METABOLIC PANEL WITH GFR
AG Ratio: 1.8 (calc) (ref 1.0–2.5)
ALT: 27 U/L (ref 9–46)
AST: 23 U/L (ref 10–35)
Albumin: 4.4 g/dL (ref 3.6–5.1)
Alkaline phosphatase (APISO): 48 U/L (ref 35–144)
BUN: 25 mg/dL (ref 7–25)
CO2: 30 mmol/L (ref 20–32)
Calcium: 10 mg/dL (ref 8.6–10.3)
Chloride: 101 mmol/L (ref 98–110)
Creat: 1.13 mg/dL (ref 0.70–1.30)
Globulin: 2.5 g/dL (calc) (ref 1.9–3.7)
Glucose, Bld: 93 mg/dL (ref 65–99)
Potassium: 4.6 mmol/L (ref 3.5–5.3)
Sodium: 138 mmol/L (ref 135–146)
Total Bilirubin: 0.7 mg/dL (ref 0.2–1.2)
Total Protein: 6.9 g/dL (ref 6.1–8.1)
eGFR: 76 mL/min/{1.73_m2} (ref >=60)

## 2022-09-07 LAB — LIPID PANEL
Cholesterol: 181 mg/dL (ref <200)
HDL: 67 mg/dL (ref >=40)
LDL Cholesterol (Calc): 89 mg/dL (calc)
Non-HDL Cholesterol (Calc): 114 mg/dL (calc) (ref <130)
Total CHOL/HDL Ratio: 2.7 (calc) (ref <5.0)
Triglycerides: 146 mg/dL (ref <150)

## 2022-09-07 LAB — CBC WITH DIFFERENTIAL/PLATELET
Absolute Monocytes: 599 cells/uL (ref 200–950)
Basophils Absolute: 40 cells/uL (ref 0–200)
Basophils Relative: 0.7 %
Eosinophils Absolute: 131 cells/uL (ref 15–500)
Eosinophils Relative: 2.3 %
HCT: 40.2 % (ref 38.5–50.0)
Hemoglobin: 14.1 g/dL (ref 13.2–17.1)
Lymphs Abs: 1545 cells/uL (ref 850–3900)
MCH: 33.8 pg — ABNORMAL HIGH (ref 27.0–33.0)
MCHC: 35.1 g/dL (ref 32.0–36.0)
MCV: 96.4 fL (ref 80.0–100.0)
MPV: 10.8 fL (ref 7.5–12.5)
Monocytes Relative: 10.5 %
Neutro Abs: 3386 cells/uL (ref 1500–7800)
Neutrophils Relative %: 59.4 %
Platelets: 252 10*3/uL (ref 140–400)
RBC: 4.17 10*6/uL — ABNORMAL LOW (ref 4.20–5.80)
RDW: 11.5 % (ref 11.0–15.0)
Total Lymphocyte: 27.1 %
WBC: 5.7 10*3/uL (ref 3.8–10.8)

## 2022-09-07 LAB — MAGNESIUM: Magnesium: 2.3 mg/dL (ref 1.5–2.5)

## 2022-12-17 IMAGING — CT CT CARDIAC CORONARY ARTERY CALCIUM SCORE
3 series · 14 of 20 positions shown, 16 images · non-contrast
Comparison: None.

CLINICAL DATA: Uncontrolled hyperlipidemia

EXAM:
CT CARDIAC CORONARY ARTERY CALCIUM SCORE
TECHNIQUE: Non-contrast imaging through the heart was performed using
prospective ECG gating. Image post processing was performed on an
independent workstation, allowing for quantitative analysis of the
heart and coronary arteries. Note that this exam targets the heart
and the chest was not imaged in its entirety.

[Series 2: calcium scoring 2.00 qr36 bestdiast 71% hrt calciu · axial · 0.43mm/px · z∈[+1531,+1639]mm · 4 of 90 slices shown]
[im 18/90  vessel]
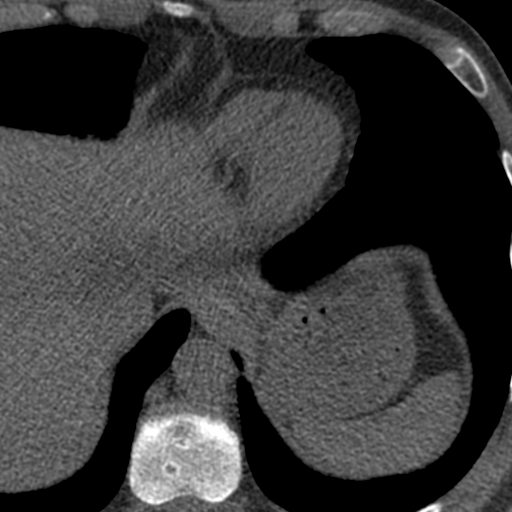
[im 36/90  vessel]
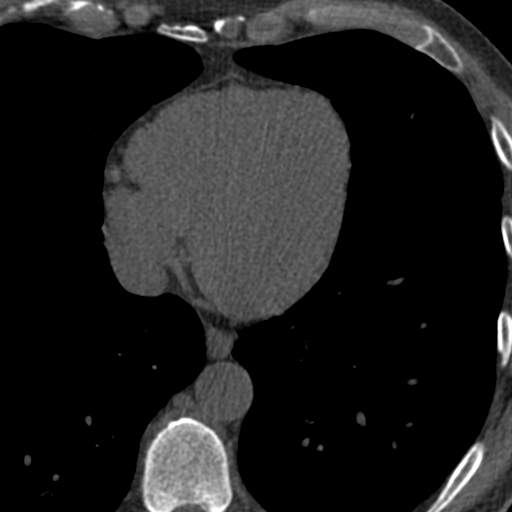
[im 54/90  vessel]
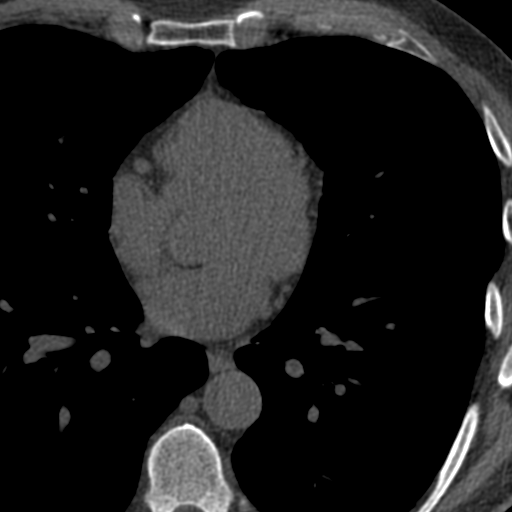
[im 72/90  vessel]
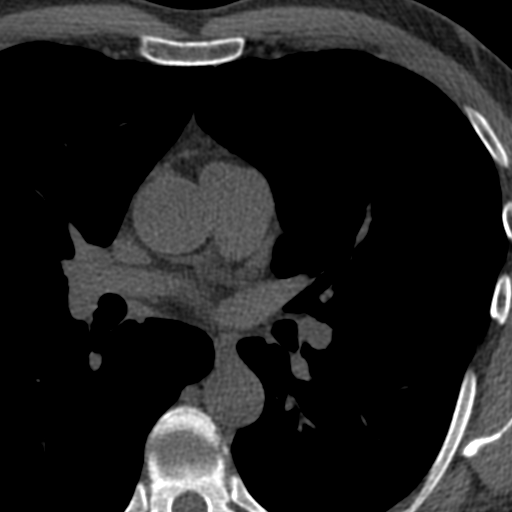

[Series 3: calcium scoring 2.00 br40 bestdiast 71% axial · axial · 0.57mm/px · z∈[+1525,+1645]mm · 5 of 90 slices shown, 7 images]
[im 15/90  vessel]
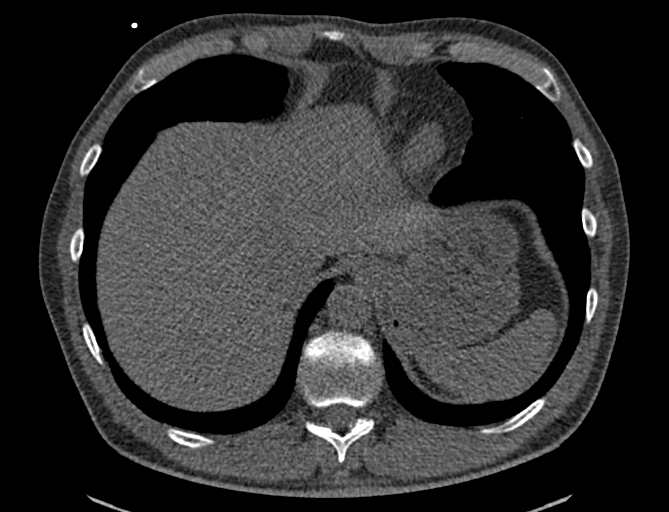
[im 15/90  lung]
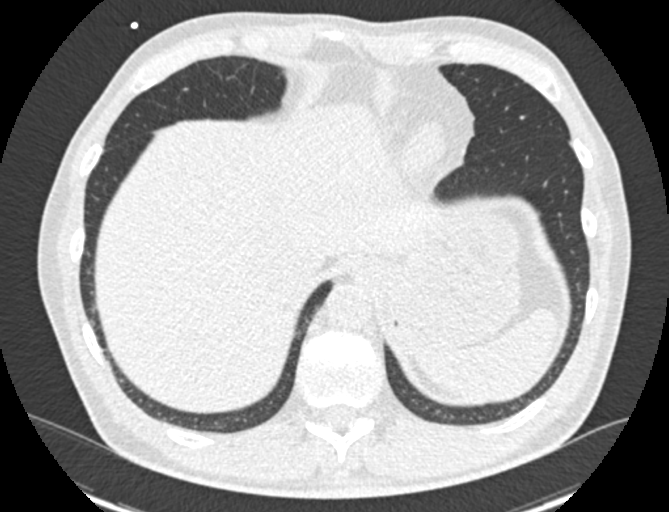
[im 30/90  vessel]
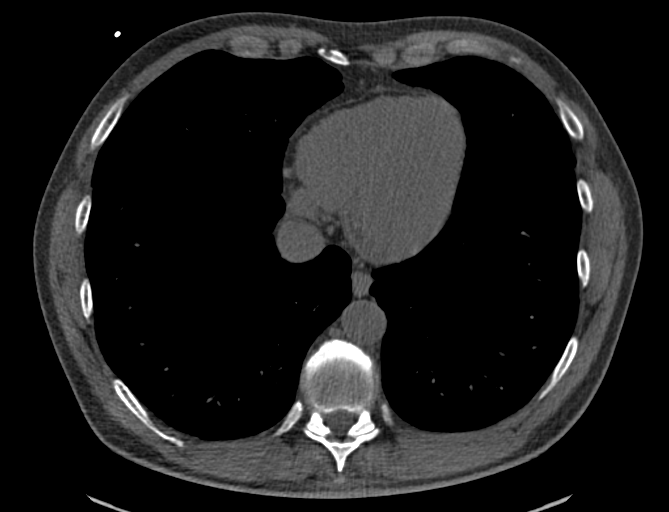
[im 45/90  vessel]
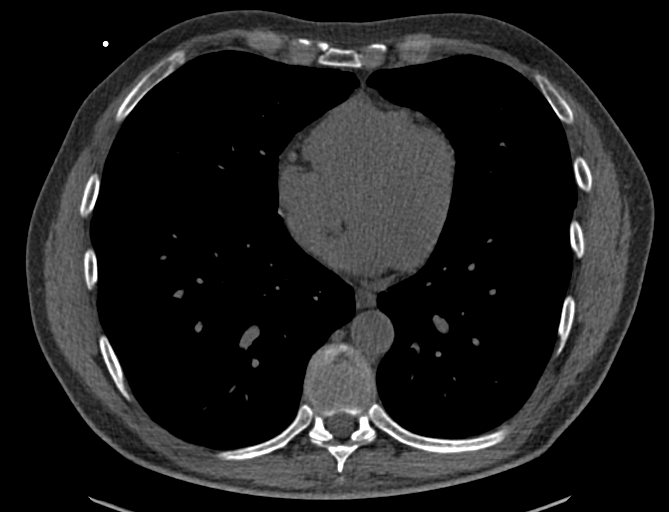
[im 60/90  vessel]
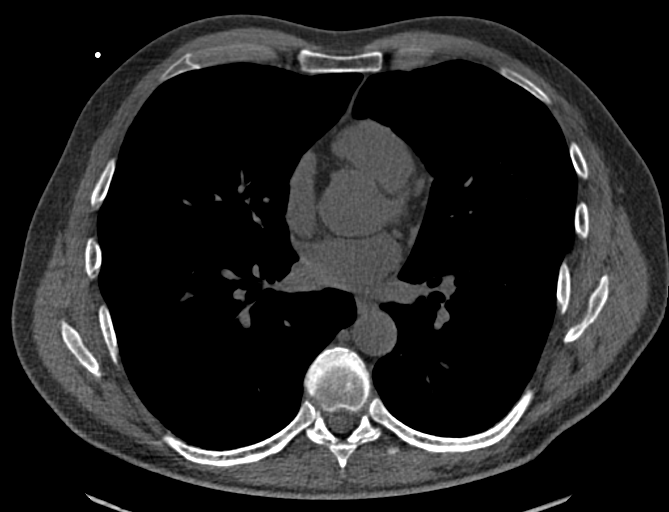
[im 75/90  vessel]
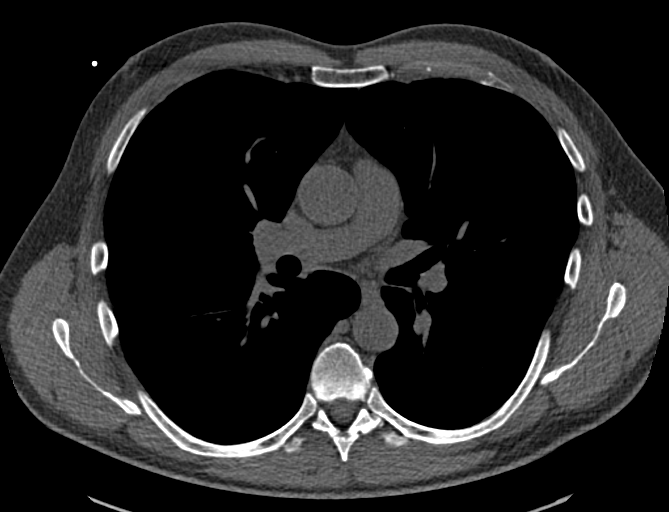
[im 75/90  lung]
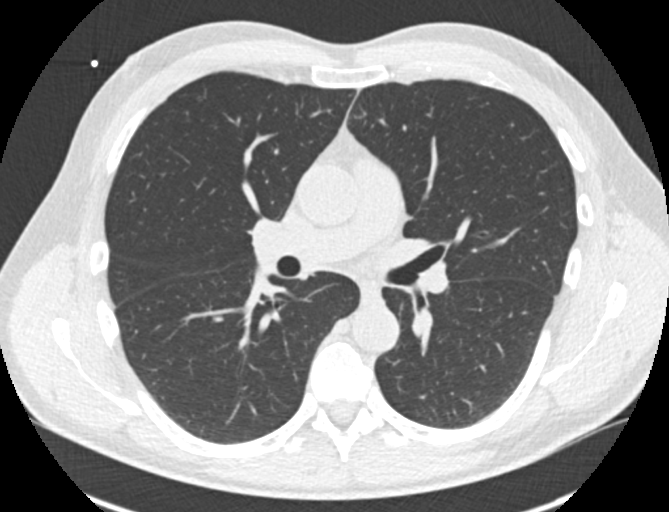

[Series 9: calcium scoring 2.00 br60 bestdiast 71% lungs · axial · 0.61mm/px · z∈[+1525,+1645]mm · 5 of 90 slices shown]
[im 15/90  vessel]
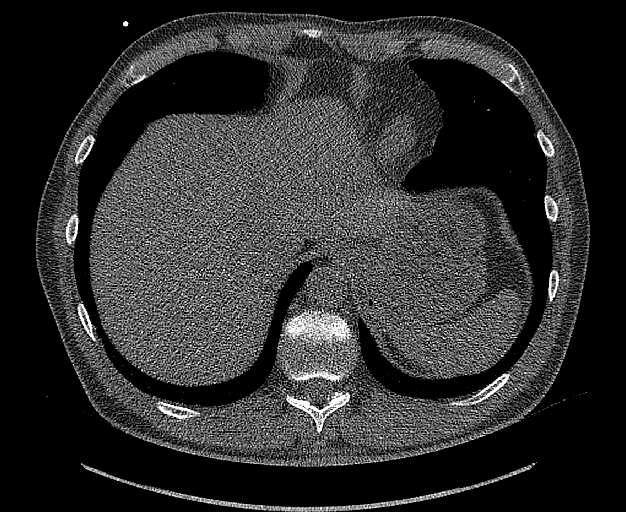
[im 30/90  vessel]
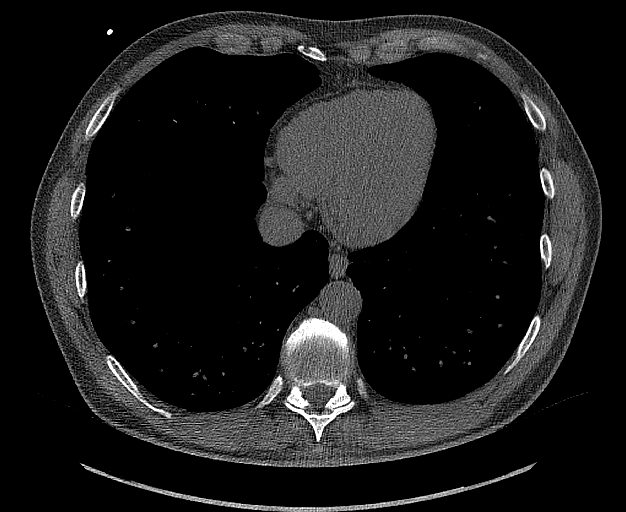
[im 45/90  vessel]
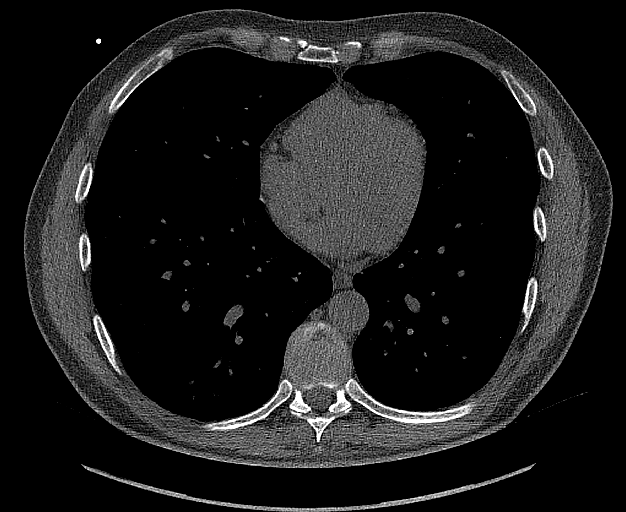
[im 60/90  vessel]
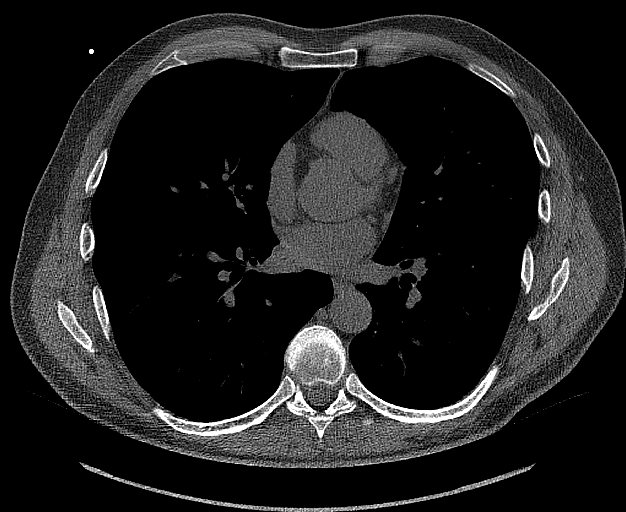
[im 75/90  vessel]
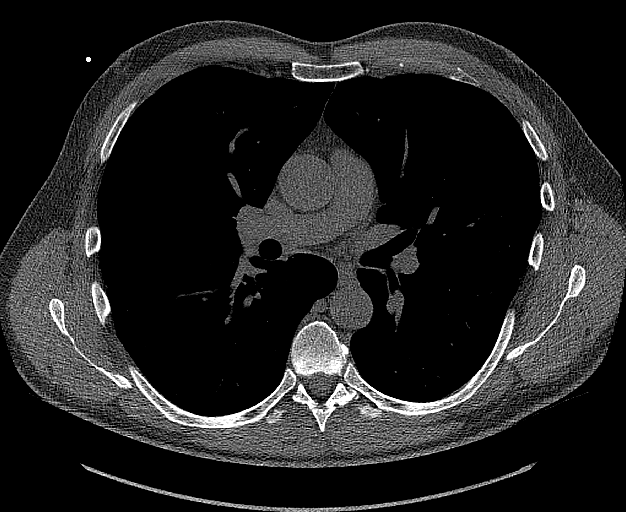

[14 of 20 positions shown; findings below may reference images not displayed]

FINDINGS: CORONARY CALCIUM SCORES:

Left Main: 0

LAD: 0

LCx: 0

RCA: 0

Total Agatston Score: 0

[HOSPITAL] percentile: 0

AORTA MEASUREMENTS:

Ascending Aorta: 33 mm

Descending Aorta: 25 mm

OTHER FINDINGS:

Heart is normal size. Aorta normal caliber. No adenopathy. No
confluent opacities or effusions. Imaging into the upper abdomen
demonstrates no acute findings. Chest wall soft tissues are
unremarkable. No acute bony abnormality.
IMPRESSION: No visible coronary artery calcifications. Total coronary calcium
score of 0.

No acute or significant extracardiac abnormality.

## 2023-01-02 LAB — HM COLONOSCOPY

## 2023-02-28 ENCOUNTER — Other Ambulatory Visit: Payer: Self-pay | Admitting: Nurse Practitioner

## 2023-02-28 DIAGNOSIS — E782 Mixed hyperlipidemia: Secondary | ICD-10-CM

## 2023-02-28 MED ORDER — ROSUVASTATIN CALCIUM 20 MG PO TABS: ORAL_TABLET | ORAL | 3 refills | Status: DC

## 2023-03-03 ENCOUNTER — Encounter: Payer: Self-pay | Admitting: Internal Medicine

## 2023-03-03 NOTE — Patient Instructions (Signed)
Due to recent changes in healthcare laws, you may see the results of your imaging and laboratory studies on MyChart before your provider has had a chance to review them.  We understand that in some cases there may be results that are confusing or concerning to you. Not all laboratory results come back in the same time frame and the provider may be waiting for multiple results in order to interpret others.  Please give us 48 hours in order for your provider to thoroughly review all the results before contacting the office for clarification of your results.   ++++++++++++++++++++++++++++++++++++++  Vit D  & Vit C 1,000 mg   are recommended to help protect  against the Covid-19 and other Corona viruses.    Also it's recommended  to take  Zinc 50 mg  to help  protect against the Covid-19   and best place to get  is also on [employer]  and don't pay more than 6-8 cents /pill !  =============================== Coronavirus (COVID-19) Are you at risk?  Are you at risk for the Coronavirus (COVID-19)?  To be considered HIGH RISK for Coronavirus (COVID-19), you have to meet the following criteria:  Traveled to [place], [place], [place], [place] or [place]; or in the [place] to [place], [place], [place]  or [place]; and have fever, cough, and shortness of breath within the last 2 weeks of travel OR Been in close contact with a person diagnosed with COVID-19 within the last 2 weeks and have  fever, cough,and shortness of breath  IF YOU DO NOT MEET THESE CRITERIA, YOU ARE CONSIDERED LOW RISK FOR COVID-19.  What to do if you are HIGH RISK for COVID-19?  If you are having a medical emergency, call 911. Seek medical care right away. Before you go to a doctor's office, urgent care or emergency department,  call ahead and tell them about your recent travel, contact with someone diagnosed with COVID-19   and your symptoms.  You should receive instructions from your physician's office  regarding next steps of care.  When you arrive at healthcare provider, tell the healthcare staff immediately you have returned from  visiting [place], [place], [place], [place] or [place]; or traveled in the [place] to [place], [place],  [place] or [place] in the last two weeks or you have been in close contact with a person diagnosed with  COVID-19 in the last 2 weeks.   Tell the health care staff about your symptoms: fever, cough and shortness of breath. After you have been seen by a medical provider, you will be either: Tested for (COVID-19) and discharged home on quarantine except to seek medical care if  symptoms worsen, and asked to  Stay home and avoid contact with others until you get your results (4-5 days)  Avoid travel on public transportation if possible (such as bus, train, or airplane) or Sent to the Emergency Department by EMS for evaluation, COVID-19 testing  and  possible admission depending on your condition and test results.  What to do if you are LOW RISK for COVID-19?  Reduce your risk of any infection by using the same precautions used for avoiding the common cold or flu:  Wash your hands often with soap and warm water for at least 20 seconds.  If soap and water are not readily available,  use an alcohol-based hand sanitizer with at least 60% alcohol.  If coughing or sneezing, cover your mouth and nose by coughing   or sneezing into the elbow areas of your shirt or coat,  into a tissue or into your sleeve (not your hands). Avoid shaking hands with others and consider head nods or verbal greetings only. Avoid touching your eyes, nose, or mouth with unwashed hands.  Avoid close contact with people who are sick. Avoid places or events with large numbers of people in one location, like concerts or sporting events. Carefully consider travel plans you have or are making. If you are planning any travel outside or inside the [place], visit the CDC's Travelers' Health  webpage for the latest health notices. If you have some symptoms but not all symptoms, continue to monitor at home and seek medical attention  if your symptoms worsen. If you are having a medical emergency, call 911. >>>>>>>>>>>>>>>>>>>>>>>>>>>> Preventive Care for Adults  A healthy lifestyle and preventive care can promote health and wellness. Preventive health guidelines for men include the following key practices: A routine yearly physical is a good way to check with your health care provider about your health and preventative screening. It is a chance to share any concerns and updates on your health and to receive a thorough exam. Visit your dentist for a routine exam and preventative care every 6 months. Brush your teeth twice a day and floss once a day. Good oral hygiene prevents tooth decay and gum disease. The frequency of eye exams is based on your age, health, family medical history, use of contact lenses, and other factors. Follow your health care provider's recommendations for frequency of eye exams. Eat a healthy diet. Foods such as vegetables, fruits, whole grains, low-fat dairy products, and lean protein foods contain the nutrients you need without too many calories. Decrease your intake of foods high in solid fats, added sugars, and salt. Eat the right amount of calories for you. Get information about a proper diet from your health care provider, if necessary. Regular physical exercise is one of the most important things you can do for your health. Most adults should get at least 150 minutes of moderate-intensity exercise (any activity that increases your heart rate and causes you to sweat) each week. In addition, most adults need muscle-strengthening exercises on 2 or more days a week. Maintain a healthy weight. The body mass index (BMI) is a screening tool to identify possible weight problems. It provides an estimate of body fat based on height and weight. Your health care provider can  find your BMI and can help you achieve or maintain a healthy weight. For adults 20 years and older: A BMI below 18.5 is considered underweight. A BMI of 18.5 to 24.9 is normal. A BMI of 25 to 29.9 is considered overweight. A BMI of 30 and above is considered obese. Maintain normal blood lipids and cholesterol levels by exercising and minimizing your intake of saturated fat. Eat a balanced diet with plenty of fruit and vegetables. Blood tests for lipids and cholesterol should begin at age 20 and be repeated every 5 years. If your lipid or cholesterol levels are high, you are over 50, or you are at high risk for heart disease, you may need your cholesterol levels checked more frequently. Ongoing high lipid and cholesterol levels should be treated with medicines if diet and exercise are not working. If you smoke, find out from your health care provider how to quit. If you do not use tobacco, do not start. Lung cancer screening is recommended for adults aged 55-80 years who are at high risk for   developing lung cancer because of a history of smoking. A yearly low-dose CT scan of the lungs is recommended for people who have at least a 30-pack-year history of smoking and are a current smoker or have quit within the past 15 years. A pack year of smoking is smoking an average of 1 pack of cigarettes a day for 1 year (for example: 1 pack a day for 30 years or 2 packs a day for 15 years). Yearly screening should continue until the smoker has stopped smoking for at least 15 years. Yearly screening should be stopped for people who develop a health problem that would prevent them from having lung cancer treatment. If you choose to drink alcohol, do not have more than 2 drinks per day. One drink is considered to be 12 ounces (355 mL) of beer, 5 ounces (148 mL) of wine, or 1.5 ounces (44 mL) of liquor. Avoid use of street drugs. Do not share needles with anyone. Ask for help if you need support or instructions about  stopping the use of drugs. High blood pressure causes heart disease and increases the risk of stroke. Your blood pressure should be checked at least every 1-2 years. Ongoing high blood pressure should be treated with medicines, if weight loss and exercise are not effective. If you are 45-79 years old, ask your health care provider if you should take aspirin to prevent heart disease. Diabetes screening involves taking a blood sample to check your fasting blood sugar level. This should be done once every 3 years, after age 45, if you are within normal weight and without risk factors for diabetes. Testing should be considered at a younger age or be carried out more frequently if you are overweight and have at least 1 risk factor for diabetes. Colorectal cancer can be detected and often prevented. Most routine colorectal cancer screening begins at the age of 50 and continues through age 75. However, your health care provider may recommend screening at an earlier age if you have risk factors for colon cancer. On a yearly basis, your health care provider may provide home test kits to check for hidden blood in the stool. Use of a small camera at the end of a tube to directly examine the colon (sigmoidoscopy or colonoscopy) can detect the earliest forms of colorectal cancer. Talk to your health care provider about this at age 50, when routine screening begins. Direct exam of the colon should be repeated every 5-10 years through age 75, unless early forms of precancerous polyps or small growths are found.  Talk with your health care provider about prostate cancer screening. Testicular cancer screening isrecommended for adult males. Screening includes self-exam, a health care provider exam, and other screening tests. Consult with your health care provider about any symptoms you have or any concerns you have about testicular cancer. Use sunscreen. Apply sunscreen liberally and repeatedly throughout the day. You should  seek shade when your shadow is shorter than you. Protect yourself by wearing long sleeves, pants, a wide-brimmed hat, and sunglasses year round, whenever you are outdoors. Once a month, do a whole-body skin exam, using a mirror to look at the skin on your back. Tell your health care provider about new moles, moles that have irregular borders, moles that are larger than a pencil eraser, or moles that have changed in shape or color. Stay current with required vaccines (immunizations). Influenza vaccine. All adults should be immunized every year. Tetanus, diphtheria, and acellular pertussis (Td, Tdap) vaccine. An   adult who has not previously received Tdap or who does not know his vaccine status should receive 1 dose of Tdap. This initial dose should be followed by tetanus and diphtheria toxoids (Td) booster doses every 10 years. Adults with an unknown or incomplete history of completing a 3-dose immunization series with Td-containing vaccines should begin or complete a primary immunization series including a Tdap dose. Adults should receive a Td booster every 10 years. Varicella vaccine. An adult without evidence of immunity to varicella should receive 2 doses or a second dose if he has previously received 1 dose. Human papillomavirus (HPV) vaccine. Males aged 13-21 years who have not received the vaccine previously should receive the 3-dose series. Males aged 22-26 years may be immunized. Immunization is recommended through the age of 26 years for any male who has sex with males and did not get any or all doses earlier. Immunization is recommended for any person with an immunocompromised condition through the age of 26 years if he did not get any or all doses earlier. During the 3-dose series, the second dose should be obtained 4-8 weeks after the first dose. The third dose should be obtained 24 weeks after the first dose and 16 weeks after the second dose. Zoster vaccine. One dose is recommended for adults  aged 60 years or older unless certain conditions are present.  PREVNAR  - Pneumococcal 13-valent conjugate (PCV13) vaccine. When indicated, a person who is uncertain of his immunization history and has no record of immunization should receive the PCV13 vaccine. An adult aged 19 years or older who has certain medical conditions and has not been previously immunized should receive 1 dose of PCV13 vaccine. This PCV13 should be followed with a dose of pneumococcal polysaccharide (PPSV23) vaccine. The PPSV23 vaccine dose should be obtained at least 1 r more year(s) after the dose of PCV13 vaccine. An adult aged 19 years or older who has certain medical conditions and previously received 1 or more doses of PPSV23 vaccine should receive 1 dose of PCV13. The PCV13 vaccine dose should be obtained 1 or more years after the last PPSV23 vaccine dose.  PNEUMOVAX - Pneumococcal polysaccharide (PPSV23) vaccine. When PCV13 is also indicated, PCV13 should be obtained first. All adults aged 65 years and older should be immunized. An adult younger than age 65 years who has certain medical conditions should be immunized. Any person who resides in a nursing home or long-term care facility should be immunized. An adult smoker should be immunized. People with an immunocompromised condition and certain other conditions should receive both PCV13 and PPSV23 vaccines. People with human immunodeficiency virus (HIV) infection should be immunized as soon as possible after diagnosis. Immunization during chemotherapy or radiation therapy should be avoided. Routine use of PPSV23 vaccine is not recommended for American Indians, [place] Natives, or people younger than 65 years unless there are medical conditions that require PPSV23 vaccine. When indicated, people who have unknown immunization and have no record of immunization should receive PPSV23 vaccine. One-time revaccination 5 years after the first dose of PPSV23 is recommended for people  aged 19-64 years who have chronic kidney failure, nephrotic syndrome, asplenia, or immunocompromised conditions. People who received 1-2 doses of PPSV23 before age 65 years should receive another dose of PPSV23 vaccine at age 65 years or later if at least 5 years have passed since the previous dose. Doses of PPSV23 are not needed for people immunized with PPSV23 at or after age 65 years.  Hepatitis A vaccine.   Adults who wish to be protected from this disease, have certain high-risk conditions, work with hepatitis A-infected animals, work in hepatitis A research labs, or travel to or work in countries with a high rate of hepatitis A should be immunized. Adults who were previously unvaccinated and who anticipate close contact with an international adoptee during the first 60 days after arrival in the [place] from a country with a high rate of hepatitis A should be immunized.  Hepatitis B vaccine. Adults should be immunized if they wish to be protected from this disease, have certain high-risk conditions, may be exposed to blood or other infectious body fluids, are household contacts or sex partners of hepatitis B positive people, are clients or workers in certain care facilities, or travel to or work in countries with a high rate of hepatitis B.  Preventive Service / Frequency  Ages 40 to 64 Blood pressure check. Lipid and cholesterol check Lung cancer screening. / Every year if you are aged 55-80 years and have a 30-pack-year history of smoking and currently smoke or have quit within the past 15 years. Yearly screening is stopped once you have quit smoking for at least 15 years or develop a health problem that would prevent you from having lung cancer treatment. Fecal occult blood test (FOBT) of stool. / Every year beginning at age 50 and continuing until age 75. You may not have to do this test if you get a colonoscopy every 10 years. Flexible sigmoidoscopy** or colonoscopy.** / Every 5 years for  a flexible sigmoidoscopy or every 10 years for a colonoscopy beginning at age 50 and continuing until age 75. Screening for abdominal aortic aneurysm (AAA)  by ultrasound is recommended for people who have history of high blood pressure or who are current or former smokers. +++++++++++ Recommend Adult Low Dose Aspirin or  coated  Aspirin 81 mg daily  To reduce risk of Colon Cancer 40 %,  Skin Cancer 26 % ,  Malignant Melanoma 46%  and  Pancreatic cancer 60% ++++++++++++++++++++ Vitamin D goal  is between 70-100.  Please make sure that you are taking your Vitamin D as directed.  It is very important as a natural anti-inflammatory  helping hair, skin, and nails, as well as reducing stroke and heart attack risk.  It helps your bones and helps with mood. It also decreases numerous cancer risks so please take it as directed.  Low Vit D is associated with a 200-300% higher risk for CANCER  and 200-300% higher risk for HEART   ATTACK  &  STROKE.   ...................................... It is also associated with higher death rate at younger ages,  autoimmune diseases like Rheumatoid arthritis, Lupus, Multiple Sclerosis.    Also many other serious conditions, like depression, Alzheimer's Dementia, infertility, muscle aches, fatigue, fibromyalgia - just to name a few. +++++++++++++++++++++ Recommend the book "The END of DIETING" by Dr Joel Fuhrman  & the book "The END of DIABETES " by Dr Joel Fuhrman At [employer] - get book & Audio CD's    Being diabetic has a  300% increased risk for heart attack, stroke, cancer, and alzheimer- type vascular dementia. It is very important that you work harder with diet by avoiding all foods that are white. Avoid white rice (brown & wild rice is OK), white potatoes (sweetpotatoes in moderation is OK), White bread or wheat bread or anything made out of white flour like bagels, donuts, rolls, buns, biscuits, cakes, pastries, cookies, pizza crust, and pasta (made    from white flour & egg whites) - vegetarian pasta or spinach or wheat pasta is OK. Multigrain breads like Arnold's or Pepper[employer], or multigrain sandwich thins or flatbreads.  Diet, exercise and weight loss can reverse and cure diabetes in the early stages.  Diet, exercise and weight loss is very important in the control and prevention of complications of diabetes which affects every system in your body, ie. Brain - dementia/stroke, eyes - glaucoma/blindness, heart - heart attack/heart failure, kidneys - dialysis, stomach - gastric paralysis, intestines - malabsorption, nerves - severe painful neuritis, circulation - gangrene & loss of a leg(s), and finally cancer and Alzheimers.    I recommend avoid fried & greasy foods,  sweets/candy, white rice (brown or wild rice or Quinoa is OK), white potatoes (sweet potatoes are OK) - anything made from white flour - bagels, doughnuts, rolls, buns, biscuits,white and wheat breads, pizza crust and traditional pasta made of white flour & egg white(vegetarian pasta or spinach or wheat pasta is OK).  Multi-grain bread is OK - like multi-grain flat bread or sandwich thins. Avoid alcohol in excess. Exercise is also important.    Eat all the vegetables you want - avoid meat, especially red meat and dairy - especially cheese.  Cheese is the most concentrated form of trans-fats which is the worst thing to clog up our arteries. Veggie cheese is OK which can be found in the fresh produce section at [employer] or [employer] or [employer]  ++++++++++++++++++++++ DASH Eating Plan  DASH stands for "Dietary Approaches to Stop Hypertension."   The DASH eating plan is a healthy eating plan that has been shown to reduce high blood pressure (hypertension). Additional health benefits may include reducing the risk of type 2 diabetes mellitus, heart disease, and stroke. The DASH eating plan may also help with weight loss. WHAT DO I NEED TO KNOW ABOUT THE DASH EATING PLAN? For  the DASH eating plan, you will follow these general guidelines: Choose foods with a percent daily value for sodium of less than 5% (as listed on the food label). Use salt-free seasonings or herbs instead of table salt or sea salt. Check with your health care provider or pharmacist before using salt substitutes. Eat lower-sodium products, often labeled as "lower sodium" or "no salt added." Eat fresh foods. Eat more vegetables, fruits, and low-fat dairy products. Choose whole grains. Look for the word "whole" as the first word in the ingredient list. Choose fish  Limit sweets, desserts, sugars, and sugary drinks. Choose heart-healthy fats. Eat veggie cheese  Eat more home-cooked food and less restaurant, buffet, and fast food. Limit fried foods. Cook foods using methods other than frying. Limit canned vegetables. If you do use them, rinse them well to decrease the sodium. When eating at a restaurant, ask that your food be prepared with less salt, or no salt if possible.                      WHAT FOODS CAN I EAT? Read Dr Joel Fuhrman's books on The End of Dieting & The End of Diabetes  Grains Whole grain or whole wheat bread. Brown rice. Whole grain or whole wheat pasta. Quinoa, bulgur, and whole grain cereals. Low-sodium cereals. Corn or whole wheat flour tortillas. Whole grain cornbread. Whole grain crackers. Low-sodium crackers.  Vegetables Fresh or frozen vegetables (raw, steamed, roasted, or grilled). Low-sodium or reduced-sodium tomato and vegetable juices. Low-sodium or reduced-sodium tomato sauce and paste. Low-sodium or reduced-sodium canned vegetables.     Fruits All fresh, canned (in natural juice), or frozen fruits.  Protein Products  All fish and seafood.  Dried beans, peas, or lentils. Unsalted nuts and seeds. Unsalted canned beans.  Dairy Low-fat dairy products, such as skim or 1% milk, 2% or reduced-fat cheeses, low-fat ricotta or cottage cheese, or plain low-fat yogurt.  Low-sodium or reduced-sodium cheeses.  Fats and Oils Tub margarines without trans fats. Light or reduced-fat mayonnaise and salad dressings (reduced sodium). Avocado. Safflower, olive, or canola oils. Natural peanut or almond butter.  Other Unsalted popcorn and pretzels. The items listed above may not be a complete list of recommended foods or beverages. Contact your dietitian for more options.  +++++++++++++++++++  WHAT FOODS ARE NOT RECOMMENDED? Grains/ White flour or wheat flour White bread. White pasta. White rice. Refined cornbread. Bagels and croissants. Crackers that contain trans fat.  Vegetables  Creamed or fried vegetables. Vegetables in a . Regular canned vegetables. Regular canned tomato sauce and paste. Regular tomato and vegetable juices.  Fruits Dried fruits. Canned fruit in light or heavy syrup. Fruit juice.  Meat and Other Protein Products Meat in general - RED meat & White meat.  Fatty cuts of meat. Ribs, chicken wings, all processed meats as bacon, sausage, bologna, salami, fatback, hot dogs, bratwurst and packaged luncheon meats.  Dairy Whole or 2% milk, cream, half-and-half, and cream cheese. Whole-fat or sweetened yogurt. Full-fat cheeses or blue cheese. Non-dairy creamers and whipped toppings. Processed cheese, cheese spreads, or cheese curds.  Condiments Onion and garlic salt, seasoned salt, table salt, and sea salt. Canned and packaged gravies. Worcestershire sauce. Tartar sauce. Barbecue sauce. Teriyaki sauce. Soy sauce, including reduced sodium. Steak sauce. Fish sauce. Oyster sauce. Cocktail sauce. Horseradish. Ketchup and mustard. Meat flavorings and tenderizers. Bouillon cubes. Hot sauce. Tabasco sauce. Marinades. Taco seasonings. Relishes.  Fats and Oils Butter, stick margarine, lard, shortening and bacon fat. Coconut, palm kernel, or palm oils. Regular salad dressings.  Pickles and olives. Salted popcorn and pretzels.  The items listed above may not  be a complete list of foods and beverages to avoid.   

## 2023-03-03 NOTE — Progress Notes (Signed)
Annual  Screening/Preventative Visit  & Comprehensive Evaluation & Examination   Future Appointments  Date Time Provider Department  03/04/2023                          cpe  2:00 PM [name], MD [facility]  03/09/2024                        cpe  2:00 PM [name], MD [facility]            This very nice 57 y.o. MWM with  labile HTN, HLD, Prediabetes and Vitamin D Deficiency presents for a Screening /Preventative Visit & comprehensive evaluation and management of multiple medical co-morbidities.        Patient had laparoscopic Lt shoulder surgery for a   SLAP tear  and  rotator cuff tear  by Dr Ranell Patrick about 4 weeks ago and is still in PT  for same. Apparently he has also seen recently by Dr Lequita Halt re: future TKAs.        Labile HTN predates since  2006.  Patient's BP has been controlled and today's BP is at goal -  102/68 . Patient denies any cardiac symptoms as chest pain, palpitations, shortness of breath, dizziness or ankle swelling.        Patient's hyperlipidemia is controlled with diet and Rosuvastatin 20mg . Patient denies myalgias or other medication SE's. Last lipids were at goal :  Lab Results  Component Value Date   CHOL 181 09/06/2022   HDL 67 09/06/2022   LDLCALC 89 09/06/2022   TRIG 146 09/06/2022   CHOLHDL 2.7 09/06/2022         Patient has hx/o prediabetes (A1c 5.9% /2015) and patient denies reactive hypoglycemic symptoms, visual blurring, diabetic polys or paresthesias. Last A1c was normal & at goal :   Lab Results  Component Value Date   HGBA1C 5.5 02/27/2022         Finally, patient has history of Vitamin D Deficiency ("27" /2008) and last vitamin D was at goal :   Lab Results  Component Value Date   VD25OH 95 02/27/2022       Current Outpatient Medications  Medication Instructions   aspirin EC  81 mg  Daily   VITAMIN D   10,000 Units Daily   Loratadine  10 mg Daily   Multiple Vitamins-Minerals  Daily   rosuvastatin  20 MG  tablet Take  1 tablet  Daily      No Known Allergies   Past Medical History:  Diagnosis Date   Allergic rhinitis    Elevated lipids    Labile hypertension    Vitamin D deficiency     Health Maintenance  Topic Date Due   COVID-19 Vaccine (3 - Pfizer risk series) 08/14/2021   INFLUENZA VACCINE  05/29/2022   TETANUS/TDAP  10/04/2025   Hepatitis C Screening  Completed   HIV Screening  Completed   Zoster Vaccines- Shingrix  Completed   HPV VACCINES  Aged Out    Immunization History  Administered Date(s) Administered   Influenza Inj Mdck Quad  07/19/2020   Influenza Split 09/20/2014   Influenza, Quadrivalent, Recombinant 07/13/2019   Influenza, Seasonal 10/05/2015   Influenza 08/03/2017, 09/12/2018, 07/17/2021   Moderna SARS-COV2 Booster Vacc 07/17/2021   PFIZER-SARS-COV-2 Vacc 12/31/2019, 01/26/2020   PPD Test 01/06/2018, 01/22/2019, 02/22/2020, 02/23/2021   Tdap 10/05/2015   Zoster Recombinat (Shingrix) 09/12/2018, 11/21/2018  Colon - 01/08/2018 - Dr Kerin Salen  [facility]) - recc 5 yr   f/u  Last Colon - 01/02/2023 - Dr Kerin Salen [facility]) - Recc 5 year  FU for (+) FHx Colon ca in father.    Past Surgical History:  Procedure Laterality Date   ANTERIOR CRUCIATE LIGAMENT REPAIR Left 1986   ORIF ANKLE FRACTURE Right 2007    Family History  Problem Relation Age of Onset   Depression Mother    Cancer Father        colon     Social History   Socioeconomic History   Marital status: Married      Spouse name: Lynden Ang   Number of children: 2 sons - 37 & 75 yo. No Grandchildren.  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 Does c/o  fatigue. Eyes: Denies redness, blurred vision, diplopia, discharge, itchy or watery eyes.  ENT: Denies discharge, congestion, post nasal drip, epistaxis, sore throat, earache, hearing loss, dental pain, Tinnitus, Vertigo, Sinus pain or snoring.  Cardio: Denies chest pain, palpitations, irregular heartbeat, syncope, dyspnea, diaphoresis, orthopnea, PND, claudication or edema Respiratory: denies cough, dyspnea, DOE, pleurisy, hoarseness, laryngitis or wheezing.  Gastrointestinal: Denies dysphagia, heartburn, reflux, water brash, pain, cramps, nausea, vomiting, bloating, diarrhea, constipation, hematemesis, melena, hematochezia, jaundice or hemorrhoids Genitourinary: Denies dysuria, frequency, urgency, nocturia, hesitancy, discharge, hematuria or flank pain Musculoskeletal: Denies arthralgia, myalgia, stiffness, Jt. Swelling, pain, limp or strain/sprain. Denies Falls. Skin: Denies puritis, rash, hives, warts, acne, eczema or change in skin lesion Neuro: No weakness, tremor, incoordination, spasms, paresthesia or pain Psychiatric: Denies confusion, memory loss or sensory loss. Denies Depression. Endocrine: Denies change in weight, skin, hair change, nocturia, and paresthesia, diabetic polys, visual blurring or hyper / hypo glycemic episodes.  Heme/Lymph: No excessive bleeding, bruising or enlarged lymph nodes.   Physical Exam  BP 102/68   Pulse 66   Temp (!) 97.5 F (36.4 C)   Resp 16   Ht 6' (1.829 m)   Wt 195 lb (88.5 kg)   SpO2 97%   BMI 26.45 kg/m   General Appearance: Well nourished and well groomed and in no apparent distress.   Eyes: PERRLA, EOMs, conjunctiva no swelling or erythema, normal fundi and vessels. Sinuses: No frontal/maxillary tenderness ENT/Mouth: EACs patent / TMs  nl. Nares clear without erythema, swelling, mucoid exudates. Oral hygiene is good. No erythema, swelling, or exudate. Tongue normal, non-obstructing. Tonsils not swollen or erythematous. Hearing normal.   Neck: Supple, thyroid not palpable. No bruits, nodes or JVD. Respiratory: Respiratory effort normal.  BS equal and clear bilateral without rales, rhonci, wheezing or stridor. Cardio: Heart sounds are normal with regular rate and rhythm and no murmurs, rubs or gallops. Peripheral pulses are normal and equal bilaterally without edema. No aortic or femoral bruits. Chest: symmetric with normal excursions and percussion.  Abdomen: Soft, with Nl bowel sounds. Nontender, no guarding, rebound, hernias, masses, or organomegaly.  Lymphatics: Non tender without lymphadenopathy.  Musculoskeletal: Full ROM all peripheral extremities, joint stability, 5/5 strength, and normal gait. Skin: Warm and dry without rashes, lesions, cyanosis, clubbing or  ecchymosis.  Neuro: Cranial nerves intact, reflexes equal bilaterally. Normal muscle tone, no cerebellar symptoms. Sensation intact.  Pysch: Alert and oriented X 3 with normal affect, insight and judgment appropriate.   Assessment and Plan  1. Annual Preventative/Screening Exam    2. Labile hypertension  - EKG 12-Lead - [place], RETROPERITNL ABD,  LTD - Urinalysis, Routine w reflex microscopic - Microalbumin / creatinine urine ratio - CBC with Differential/Platelet - COMPLETE METABOLIC PANEL WITH GFR - Magnesium - TSH  3. Hyperlipidemia, mixed  - EKG 12-Lead - [place], RETROPERITNL ABD,  LTD - Lipid panel - TSH  4. Abnormal glucose  - EKG 12-Lead - [place], RETROPERITNL ABD,  LTD - Hemoglobin A1c - Insulin, random  5. Vitamin D deficiency  - VITAMIN D 25 Hydroxy   6. Screening for colorectal cancer  - POC Hemoccult Bld/Stl   7. Screening-pulmonary TB  - TB Skin Test  8. Prostate cancer screening  - PSA  9. Screening for heart disease  - EKG 12-Lead  10. Screening for AAA (aortic abdominal aneurysm)  - [place], RETROPERITNL ABD,  LTD  11. Fatigue  - Iron, Total/Total Iron Binding Cap - Vitamin B12 - Testosterone - CBC with  Differential/Platelet - TSH  12. Medication management  - Urinalysis, Routine w reflex microscopic - Microalbumin / creatinine urine ratio - CBC with Differential/Platelet - COMPLETE METABOLIC PANEL WITH GFR - Magnesium - Lipid panel - TSH - Hemoglobin A1c - Insulin, random - VITAMIN D 25 Hydroxy         Patient was counseled in prudent diet, weight control to achieve/maintain BMI less than 25, BP monitoring, regular exercise and medications as discussed.  Discussed med effects and SE's. Routine screening labs and tests as requested with regular follow-up as recommended. Over 40 minutes of exam, counseling, chart review and high complex critical decision making was performed   Marinus Maw, MD

## 2023-03-03 NOTE — Progress Notes (Incomplete)
Annual  Screening/Preventative Visit  & Comprehensive Evaluation & Examination   Future Appointments  Date Time Provider Department  03/04/2023                          cpe  2:00 PM [name], MD [facility]  03/09/2024                        cpe  2:00 PM [name], MD [facility]            This very nice 57 y.o. MWM with  labile HTN, HLD, Prediabetes and Vitamin D Deficiency presents for a Screening /Preventative Visit & comprehensive evaluation and management of multiple medical co-morbidities.         Labile HTN predates since  2006.  Patient's BP has been controlled and today's BP is at goal -                    . Patient denies any cardiac symptoms as chest pain, palpitations, shortness of breath, dizziness or ankle swelling.        Patient's hyperlipidemia is controlled with diet and Rosuvastatin 20mg . Patient denies myalgias or other medication SE's. Last lipids were at goal :  Lab Results  Component Value Date   CHOL 181 09/06/2022   HDL 67 09/06/2022   LDLCALC 89 09/06/2022   TRIG 146 09/06/2022   CHOLHDL 2.7 09/06/2022         Patient has hx/o prediabetes (A1c 5.9% /2015) and patient denies reactive hypoglycemic symptoms, visual blurring, diabetic polys or paresthesias. Last A1c was normal & at goal :   Lab Results  Component Value Date   HGBA1C 5.5 02/27/2022         Finally, patient has history of Vitamin D Deficiency ("27" /2008) and last vitamin D was at goal :   Lab Results  Component Value Date   VD25OH 95 02/27/2022       Current Outpatient Medications  Medication Instructions  . aspirin EC  81 mg  Daily  . VITAMIN D   10,000 Units Daily  . Loratadine  10 mg Daily  . Multiple Vitamins-Minerals  Daily  . rosuvastatin  20 MG tablet Take  1 tablet  Daily      No Known Allergies   Past Medical History:  Diagnosis Date  . Allergic rhinitis   . Elevated lipids   . Labile hypertension   . Vitamin D deficiency     Health  Maintenance  Topic Date Due  . COVID-19 Vaccine (3 - Pfizer risk series) 08/14/2021  . INFLUENZA VACCINE  05/29/2022  . TETANUS/TDAP  10/04/2025  . Hepatitis C Screening  Completed  . HIV Screening  Completed  . Zoster Vaccines- Shingrix  Completed  . HPV VACCINES  Aged Out    Immunization History  Administered Date(s) Administered  . Influenza Inj Mdck Quad  07/19/2020  . Influenza Split 09/20/2014  . Influenza, Quadrivalent, Recombinant 07/13/2019  . Influenza, Seasonal 10/05/2015  . Influenza 08/03/2017, 09/12/2018, 07/17/2021  . Moderna SARS-COV2 Booster Vacc 07/17/2021  . PFIZER-SARS-COV-2 Vacc 12/31/2019, 01/26/2020  . PPD Test 01/06/2018, 01/22/2019, 02/22/2020, 02/23/2021  . Tdap 10/05/2015  . Zoster Recombinat (Shingrix) 09/12/2018, 11/21/2018   Colon - 01/08/2018 - Dr Marcos Eke Marca [place]  [facility]) - recc 5 yr f/u due Mar 2024  Last Colon - 01/02/2023 - Dr Kerin Salen [facility]) -   Past  Surgical History:  Procedure Laterality Date  . ANTERIOR CRUCIATE LIGAMENT REPAIR Left 1986  . ORIF ANKLE FRACTURE Right 2007    Family History  Problem Relation Age of Onset  . Depression Mother   . Cancer Father        colon     Social History   Socioeconomic History  . Marital status: Married      Spouse name: Lynden Ang  . Number of children: 2 sons - 22 & 20 yo. No Grandchildren.  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 Smoking status: Never  . Smokeless tobacco: Never  Substance Use Topics  . Alcohol use: Yes    Alcohol/week: 0.0 standard drinks    Comment: Social  . Drug use: No      ROS Constitutional: Denies fever, chills, weight loss/gain, headaches, insomnia,  night sweats or change in appetite. Does c/o fatigue. Eyes: Denies redness, blurred vision, diplopia, discharge, itchy or watery eyes.  ENT: Denies discharge, congestion, post nasal drip, epistaxis, sore throat, earache, hearing loss, dental pain, Tinnitus, Vertigo,  Sinus pain or snoring.  Cardio: Denies chest pain, palpitations, irregular heartbeat, syncope, dyspnea, diaphoresis, orthopnea, PND, claudication or edema Respiratory: denies cough, dyspnea, DOE, pleurisy, hoarseness, laryngitis or wheezing.  Gastrointestinal: Denies dysphagia, heartburn, reflux, water brash, pain, cramps, nausea, vomiting, bloating, diarrhea, constipation, hematemesis, melena, hematochezia, jaundice or hemorrhoids Genitourinary: Denies dysuria, frequency, urgency, nocturia, hesitancy, discharge, hematuria or flank pain Musculoskeletal: Denies arthralgia, myalgia, stiffness, Jt. Swelling, pain, limp or strain/sprain. Denies Falls. Skin: Denies puritis, rash, hives, warts, acne, eczema or change in skin lesion Neuro: No weakness, tremor, incoordination, spasms, paresthesia or pain Psychiatric: Denies confusion, memory loss or sensory loss. Denies Depression. Endocrine: Denies change in weight, skin, hair change, nocturia, and paresthesia, diabetic polys, visual blurring or hyper / hypo glycemic episodes.  Heme/Lymph: No excessive bleeding, bruising or enlarged lymph nodes.   Physical Exam  There were no vitals taken for this visit.  General Appearance: Well nourished and well groomed and in no apparent distress.   Eyes: PERRLA, EOMs, conjunctiva no swelling or erythema, normal fundi and vessels. Sinuses: No frontal/maxillary tenderness ENT/Mouth: EACs patent / TMs  nl. Nares clear without erythema, swelling, mucoid exudates. Oral hygiene is good. No erythema, swelling, or exudate. Tongue normal, non-obstructing. Tonsils not swollen or erythematous. Hearing normal.  Neck: Supple, thyroid not palpable. No bruits, nodes or JVD. Respiratory: Respiratory effort normal.  BS equal and clear bilateral without rales, rhonci, wheezing or stridor. Cardio: Heart sounds are normal with regular rate and rhythm and no murmurs, rubs or gallops. Peripheral pulses are normal and equal  bilaterally without edema. No aortic or femoral bruits. Chest: symmetric with normal excursions and percussion.  Abdomen: Soft, with Nl bowel sounds. Nontender, no guarding, rebound, hernias, masses, or organomegaly.  Lymphatics: Non tender without lymphadenopathy.  Musculoskeletal: Full ROM all peripheral extremities, joint stability, 5/5 strength, and normal gait. Skin: Warm and dry without rashes, lesions, cyanosis, clubbing or  ecchymosis.  Neuro: Cranial nerves intact, reflexes equal bilaterally. Normal muscle tone, no cerebellar symptoms. Sensation intact.  Pysch: Alert and oriented X 3 with normal affect, insight and judgment appropriate.   Assessment and Plan  1. Annual Preventative/Screening Exam    2. Labile hypertension  - EKG 12-Lead - [place], RETROPERITNL ABD,  LTD - Urinalysis, Routine w reflex microscopic - Microalbumin / creatinine urine ratio - CBC with Differential/Platelet - COMPLETE METABOLIC PANEL WITH GFR -  Magnesium - TSH  3. Hyperlipidemia, mixed  - EKG 12-Lead - [place], RETROPERITNL ABD,  LTD - Lipid panel - TSH  4. Abnormal glucose  - EKG 12-Lead - [place], RETROPERITNL ABD,  LTD - Hemoglobin A1c - Insulin, random  5. Vitamin D deficiency  - VITAMIN D 25 Hydroxy   6. Screening for colorectal cancer  - POC Hemoccult Bld/Stl   7. Screening-pulmonary TB  - TB Skin Test  8. Prostate cancer screening  - PSA  9. Screening for heart disease  - EKG 12-Lead  10. Screening for AAA (aortic abdominal aneurysm)  - [place], RETROPERITNL ABD,  LTD  11. Fatigue, unspecified type  - Iron, Total/Total Iron Binding Cap - Vitamin B12 - Testosterone - CBC with Differential/Platelet - TSH  12. Medication management  - Urinalysis, Routine w reflex microscopic - Microalbumin / creatinine urine ratio - CBC with Differential/Platelet - COMPLETE METABOLIC PANEL WITH GFR - Magnesium - Lipid panel - TSH - Hemoglobin A1c - Insulin, random - VITAMIN D 25  Hydroxy         Patient was counseled in prudent diet, weight control to achieve/maintain BMI less than 25, BP monitoring, regular exercise and medications as discussed.  Discussed med effects and SE's. Routine screening labs and tests as requested with regular follow-up as recommended. Over 40 minutes of exam, counseling, chart review and high complex critical decision making was performed   Marinus Maw, MD

## 2023-03-04 ENCOUNTER — Ambulatory Visit (INDEPENDENT_AMBULATORY_CARE_PROVIDER_SITE_OTHER): Payer: BC Managed Care – PPO | Admitting: Internal Medicine

## 2023-03-04 ENCOUNTER — Encounter: Payer: Self-pay | Admitting: Internal Medicine

## 2023-03-04 VITALS — BP 102/68 | HR 66 | Temp 97.5°F | Resp 16 | Ht 72.0 in | Wt 195.0 lb

## 2023-03-04 DIAGNOSIS — Z1329 Encounter for screening for other suspected endocrine disorder: Secondary | ICD-10-CM | POA: Diagnosis not present

## 2023-03-04 DIAGNOSIS — R0989 Other specified symptoms and signs involving the circulatory and respiratory systems: Secondary | ICD-10-CM | POA: Diagnosis not present

## 2023-03-04 DIAGNOSIS — Z111 Encounter for screening for respiratory tuberculosis: Secondary | ICD-10-CM

## 2023-03-04 DIAGNOSIS — N401 Enlarged prostate with lower urinary tract symptoms: Secondary | ICD-10-CM

## 2023-03-04 DIAGNOSIS — Z13220 Encounter for screening for lipoid disorders: Secondary | ICD-10-CM

## 2023-03-04 DIAGNOSIS — Z0000 Encounter for general adult medical examination without abnormal findings: Secondary | ICD-10-CM | POA: Diagnosis not present

## 2023-03-04 DIAGNOSIS — I700 Atherosclerosis of aorta: Secondary | ICD-10-CM | POA: Diagnosis not present

## 2023-03-04 DIAGNOSIS — Z0001 Encounter for general adult medical examination with abnormal findings: Secondary | ICD-10-CM

## 2023-03-04 DIAGNOSIS — E538 Deficiency of other specified B group vitamins: Secondary | ICD-10-CM

## 2023-03-04 DIAGNOSIS — E559 Vitamin D deficiency, unspecified: Secondary | ICD-10-CM | POA: Diagnosis not present

## 2023-03-04 DIAGNOSIS — E782 Mixed hyperlipidemia: Secondary | ICD-10-CM

## 2023-03-04 DIAGNOSIS — Z125 Encounter for screening for malignant neoplasm of prostate: Secondary | ICD-10-CM

## 2023-03-04 DIAGNOSIS — Z136 Encounter for screening for cardiovascular disorders: Secondary | ICD-10-CM

## 2023-03-04 DIAGNOSIS — Z1389 Encounter for screening for other disorder: Secondary | ICD-10-CM | POA: Diagnosis not present

## 2023-03-04 DIAGNOSIS — Z131 Encounter for screening for diabetes mellitus: Secondary | ICD-10-CM | POA: Diagnosis not present

## 2023-03-04 DIAGNOSIS — R5383 Other fatigue: Secondary | ICD-10-CM

## 2023-03-04 DIAGNOSIS — R7309 Other abnormal glucose: Secondary | ICD-10-CM

## 2023-03-04 DIAGNOSIS — Z130 Encounter for screening for diseases of the blood and blood-forming organs and certain disorders involving the immune mechanism: Secondary | ICD-10-CM

## 2023-03-04 DIAGNOSIS — Z79899 Other long term (current) drug therapy: Secondary | ICD-10-CM

## 2023-03-04 DIAGNOSIS — R350 Frequency of micturition: Secondary | ICD-10-CM

## 2023-03-05 LAB — URINALYSIS, ROUTINE W REFLEX MICROSCOPIC
Bilirubin Urine: NEGATIVE
Glucose, UA: NEGATIVE
Hgb urine dipstick: NEGATIVE
Ketones, ur: NEGATIVE
Leukocytes,Ua: NEGATIVE
Nitrite: NEGATIVE
Protein, ur: NEGATIVE
Specific Gravity, Urine: 1.01 (ref 1.001–1.035)
pH: 6.5 (ref 5.0–8.0)

## 2023-03-05 LAB — CBC WITH DIFFERENTIAL/PLATELET
Absolute Monocytes: 427 cells/uL (ref 200–950)
Basophils Absolute: 32 cells/uL (ref 0–200)
Basophils Relative: 0.6 %
Eosinophils Absolute: 81 cells/uL (ref 15–500)
Eosinophils Relative: 1.5 %
HCT: 41.2 % (ref 38.5–50.0)
Hemoglobin: 14 g/dL (ref 13.2–17.1)
Lymphs Abs: 1453 cells/uL (ref 850–3900)
MCH: 31.5 pg (ref 27.0–33.0)
MCHC: 34 g/dL (ref 32.0–36.0)
MCV: 92.8 fL (ref 80.0–100.0)
MPV: 11.3 fL (ref 7.5–12.5)
Monocytes Relative: 7.9 %
Neutro Abs: 3407 cells/uL (ref 1500–7800)
Neutrophils Relative %: 63.1 %
Platelets: 244 10*3/uL (ref 140–400)
RBC: 4.44 10*6/uL (ref 4.20–5.80)
RDW: 11.3 % (ref 11.0–15.0)
Total Lymphocyte: 26.9 %
WBC: 5.4 10*3/uL (ref 3.8–10.8)

## 2023-03-05 LAB — LIPID PANEL
Cholesterol: 159 mg/dL (ref <200)
HDL: 49 mg/dL (ref >=40)
LDL Cholesterol (Calc): 88 mg/dL (calc)
Non-HDL Cholesterol (Calc): 110 mg/dL (calc) (ref <130)
Total CHOL/HDL Ratio: 3.2 (calc) (ref <5.0)
Triglycerides: 121 mg/dL (ref <150)

## 2023-03-05 LAB — COMPLETE METABOLIC PANEL WITH GFR
AG Ratio: 1.8 (calc) (ref 1.0–2.5)
ALT: 22 U/L (ref 9–46)
AST: 20 U/L (ref 10–35)
Albumin: 4.4 g/dL (ref 3.6–5.1)
Alkaline phosphatase (APISO): 49 U/L (ref 35–144)
BUN: 22 mg/dL (ref 7–25)
CO2: 29 mmol/L (ref 20–32)
Calcium: 9.5 mg/dL (ref 8.6–10.3)
Chloride: 103 mmol/L (ref 98–110)
Creat: 1.01 mg/dL (ref 0.70–1.30)
Globulin: 2.5 g/dL (calc) (ref 1.9–3.7)
Glucose, Bld: 87 mg/dL (ref 65–99)
Potassium: 4.5 mmol/L (ref 3.5–5.3)
Sodium: 140 mmol/L (ref 135–146)
Total Bilirubin: 0.7 mg/dL (ref 0.2–1.2)
Total Protein: 6.9 g/dL (ref 6.1–8.1)
eGFR: 87 mL/min/{1.73_m2} (ref >=60)

## 2023-03-05 LAB — MICROALBUMIN / CREATININE URINE RATIO
Creatinine, Urine: 46 mg/dL (ref 20–320)
Microalb, Ur: <0.2 mg/dL

## 2023-03-05 LAB — TESTOSTERONE: Testosterone: 566 ng/dL (ref 250–827)

## 2023-03-05 LAB — TSH: TSH: 1.52 mIU/L (ref 0.40–4.50)

## 2023-03-05 LAB — MAGNESIUM: Magnesium: 2.2 mg/dL (ref 1.5–2.5)

## 2023-03-05 LAB — HEMOGLOBIN A1C
Hgb A1c MFr Bld: 5.5 % of total Hgb (ref <5.7)
Mean Plasma Glucose: 111 mg/dL
eAG (mmol/L): 6.2 mmol/L

## 2023-03-05 LAB — IRON, TOTAL/TOTAL IRON BINDING CAP
%SAT: 36 % (calc) (ref 20–48)
Iron: 130 ug/dL (ref 50–180)
TIBC: 361 mcg/dL (calc) (ref 250–425)

## 2023-03-05 LAB — VITAMIN D 25 HYDROXY (VIT D DEFICIENCY, FRACTURES): Vit D, 25-Hydroxy: 90 ng/mL (ref 30–100)

## 2023-03-05 LAB — PSA: PSA: 0.59 ng/mL (ref <=4.00)

## 2023-03-05 LAB — INSULIN, RANDOM: Insulin: 6.8 u[IU]/mL

## 2023-03-05 LAB — VITAMIN B12: Vitamin B-12: 471 pg/mL (ref 200–1100)

## 2023-03-05 NOTE — Progress Notes (Signed)
^<^<^<^<^<^<^<^<^<^<^<^<^<^<^<^<^<^<^<^<^<^<^<^<^<^<^<^<^<^<^<^<^<^<^<^<^ ^>^>^>^>^>^>^>^>^>^>^>>^>^>^>^>^>^>^>^>^>^>^>^>^>^>^>^>^>^>^>^>^>^>^>^>^>  -  Test results slightly outside the reference range are not unusual. If there is anything important, I will review this with you,  otherwise it is considered normal test values.  If you have further questions,  please do not hesitate to contact me at the office or via My Chart.   ^<^<^<^<^<^<^<^<^<^<^<^<^<^<^<^<^<^<^<^<^<^<^<^<^<^<^<^<^<^<^<^<^<^<^<^<^ ^>^>^>^>^>^>^>^>^>^>^>^>^>^>^>^>^>^>^>^>^>^>^>^>^>^>^>^>^>^>^>^>^>^>^>^>^  -  Iron levels - Normal & OK  ^<^<^<^<^<^<^<^<^<^<^<^<^<^<^<^<^<^<^<^<^<^<^<^<^<^<^<^<^<^<^<^<^<^<^<^<^ ^>^>^>^>^>^>^>^>^>^>^>^>^>^>^>^>^>^>^>^>^>^>^>^>^>^>^>^>^>^>^>^>^>^>^>^>^  -  Vitamin B12 = 471  Very Low Normal  (Ideal or Goal Vit B12 is between 450 - 1,100)   - Low Vit B12 may be associated with Anemia , Fatigue,   Peripheral Neuropathy, Dementia, "Brain Fog", & Depression  - Recommend take a sub-lingual form of Vitamin B12 tablet   1,000 to 5,000 mcg tab that you dissolve under your tongue /Daily   - Can get Lavonia Dana - best price at [employer] or on [employer]  ^<^<^<^<^<^<^<^<^<^<^<^<^<^<^<^<^<^<^<^<^<^<^<^<^<^<^<^<^<^<^<^<^<^<^<^<^ ^>^>^>^>^>^>^>^>^>^>^>^>^>^>^>^>^>^>^>^>^>^>^>^>^>^>^>^>^>^>^>^>^>^>^>^>^  -  PSA - Low   - > No Prostate cancer -  Great !  ^<^<^<^<^<^<^<^<^<^<^<^<^<^<^<^<^<^<^<^<^<^<^<^<^<^<^<^<^<^<^<^<^<^<^<^<^ ^>^>^>^>^>^>^>^>^>^>^>^>^>^>^>^>^>^>^>^>^>^>^>^>^>^>^>^>^>^>^>^>^>^>^>^>^  -  Testosterone level Normal   ^<^<^<^<^<^<^<^<^<^<^<^<^<^<^<^<^<^<^<^<^<^<^<^<^<^<^<^<^<^<^<^<^<^<^<^<^ ^>^>^>^>^>^>^>^>^>^>^>^>^>^>^>^>^>^>^>^>^>^>^>^>^>^>^>^>^>^>^>^>^>^>^>^>^  - Chol = 159  &  LDL = 88 -   Both   Excellent   - Very low risk for Heart Attack  /  Stroke  ^<^<^<^<^<^<^<^<^<^<^<^<^<^<^<^<^<^<^<^<^<^<^<^<^<^<^<^<^<^<^<^<^<^<^<^<^ ^>^>^>^>^>^>^>^>^>^>^>^>^>^>^>^>^>^>^>^>^>^>^>^>^>^>^>^>^>^>^>^>^>^>^>^>^  -  A1c - Normal - No Diabetes  - Great !  ^<^<^<^<^<^<^<^<^<^<^<^<^<^<^<^<^<^<^<^<^<^<^<^<^<^<^<^<^<^<^<^<^<^<^<^<^ ^>^>^>^>^>^>^>^>^>^>^>^>^>^>^>^>^>^>^>^>^>^>^>^>^>^>^>^>^>^>^>^>^>^>^>^>^  -  Vitamin D = 90  - Excellent   !  -   Please   Keep dose same   ^<^<^<^<^<^<^<^<^<^<^<^<^<^<^<^<^<^<^<^<^<^<^<^<^<^<^<^<^<^<^<^<^<^<^<^<^ ^>^>^>^>^>^>^>^>^>^>^>^>^>^>^>^>^>^>^>^>^>^>^>^>^>^>^>^>^>^>^>^>^>^>^>^>^  -  All Else - CBC - Kidneys - Electrolytes - Liver - Magnesium & Thyroid    - all  Normal / OK  ^<^<^<^<^<^<^<^<^<^<^<^<^<^<^<^<^<^<^<^<^<^<^<^<^<^<^<^<^<^<^<^<^<^<^<^<^ ^>^>^>^>^>^>^>^>^>^>^>^>^>^>^>^>^>^>^>^>^>^>^>^>^>^>^>^>^>^>^>^>^>^>^>^>^  - Keep up the [place] Work  !  ^<^<^<^<^<^<^<^<^<^<^<^<^<^<^<^<^<^<^<^<^<^<^<^<^<^<^<^<^<^<^<^<^<^<^<^<^ ^>^>^>^>^>^>^>^>^>^>^>^>^>^>^>^>^>^>^>^>^>^>^>^>^>^>^>^>^>^>^>^>^>^>^>^>^

## 2023-04-04 ENCOUNTER — Encounter: Payer: Self-pay | Admitting: Internal Medicine

## 2023-09-04 NOTE — Progress Notes (Unsigned)
FOLLOW UP  Assessment and Plan:   Hypertension Well controlled by lifestyhle Monitor blood pressure at home; patient to call if consistently greater than 130/80 Continue DASH diet.   Reminder to go to the ER if any CP, SOB, nausea, dizziness, severe HA, changes vision/speech, left arm numbness and tingling and jaw pain.  Cholesterol Currently on Rosuvastatin 20 mg QD LDL goal <100 Continue low cholesterol diet and exercise.  Check lipid panel.   Abnormal glucose Recent A1Cs at goal Discussed diet/exercise, weight management  Defer A1C; check CMP  Overweight - BMI 26 Long discussion about weight loss, diet, and exercise Recommended diet heavy in fruits and veggies and low in animal meats, cheeses, and dairy products, appropriate calorie intake Will follow up in 6 months  Vitamin D Def At goal at last visit; continue supplementation to maintain goal of 60-100 Defer Vit D level  Osteoarthritis of left knee Follow with Patrick Stone and is scheduled for TKA in 5 days.  Medication Management - CBC - CMP - LIPID PANEL - Magnesium  Continue diet and meds as discussed. Further disposition pending results of labs. Discussed med's effects and SE's.   Over 30 minutes of exam, counseling, chart review, and critical decision making was performed.   Future Appointments  Date Time Provider Department Center  03/09/2024  2:00 PM Patrick Cowboy, MD [id number] None    ----------------------------------------------------------------------------------------------------------------------  HPI 57 y.o. male  presents for 6 month follow up on BP, cholesterol, glucose, weight and vitamin D deficiency.   Left TKA is scheduled for 09/10/23 with Patrick Stone. Left knee hurts and is bone on bone.   BMI is Body mass index is 26.56 kg/m., he has been working on diet and exercise. He lifts weights, walks dogs, rides mountain bike.  Reports limited fast food, eats lots of salads, bakes  protein. Wt Readings from Last 3 Encounters:  09/05/23 195 lb 12.8 oz (88.8 kg)  03/04/23 195 lb (88.5 kg)  09/06/22 195 lb 12.8 oz (88.8 kg)    His BP has been well controlled without medication, today their BP is BP: 112/78  BP Readings from Last 3 Encounters:  09/05/23 112/78  03/04/23 102/68  09/06/22 122/72   He does workout. He denies chest pain, shortness of breath, dizziness.    He is on cholesterol medication, Rosuvastatin 20 mg QD and denies myalgias. Medication was started after last lipid result in 02/2022.  He denies family hx of MI/CVA. The cholesterol last visit was:   Lab Results  Component Value Date   CHOL 159 03/04/2023   HDL 49 03/04/2023   LDLCALC 88 03/04/2023   TRIG 121 03/04/2023   CHOLHDL 3.2 03/04/2023    He has been working on diet and exercise for glucose management, and denies nausea, paresthesia of the feet, polydipsia, polyuria, and visual disturbances. Last A1C in the office was:  Lab Results  Component Value Date   HGBA1C 5.5 03/04/2023   Patient is on Vitamin D supplement.   Lab Results  Component Value Date   VD25OH 90 03/04/2023        Current Medications:  Current Outpatient Medications on File Prior to Visit  Medication Sig   aspirin EC 81 MG tablet Take 81 mg by mouth daily.   Cholecalciferol (VITAMIN D PO) Take 10,000 Units by mouth daily.    loratadine (CLARITIN) 10 MG tablet Take 10 mg by mouth daily.   Multiple Vitamins-Minerals (MULTIVITAMIN PO) Take by mouth daily.   rosuvastatin (CRESTOR) 20 MG  tablet Take  1 tablet  Daily  for Cholesterol   No current facility-administered medications on file prior to visit.     Allergies: No Known Allergies   Medical History:  Past Medical History:  Diagnosis Date   Allergic rhinitis    Elevated lipids    Labile hypertension    Vitamin D deficiency    Family history- Reviewed and unchanged Social history- Reviewed and unchanged   Review of Systems:  Review of Systems   Constitutional:  Negative for malaise/fatigue and weight loss.  HENT:  Negative for hearing loss and tinnitus.   Eyes:  Negative for blurred vision and double vision.  Respiratory:  Negative for cough, shortness of breath and wheezing.   Cardiovascular:  Negative for chest pain, palpitations, orthopnea, claudication and leg swelling.  Gastrointestinal:  Negative for abdominal pain, blood in stool, constipation, diarrhea, heartburn, melena, nausea and vomiting.  Genitourinary: Negative.   Musculoskeletal:  Positive for joint pain (left knee). Negative for myalgias.  Skin:  Negative for rash.  Neurological:  Negative for dizziness, tingling, sensory change, weakness and headaches.  Endo/Heme/Allergies:  Negative for polydipsia.  Psychiatric/Behavioral: Negative.    All other systems reviewed and are negative.     Physical Exam: BP 112/78   Pulse 61   Temp 97.6 F (36.4 C)   Ht 6' (1.829 m)   Wt 195 lb 12.8 oz (88.8 kg)   SpO2 99%   BMI 26.56 kg/m  Wt Readings from Last 3 Encounters:  09/05/23 195 lb 12.8 oz (88.8 kg)  03/04/23 195 lb (88.5 kg)  09/06/22 195 lb 12.8 oz (88.8 kg)   General Appearance: Well nourished, in no apparent distress. Eyes: PERRLA, EOMs, conjunctiva no swelling or erythema Sinuses: No Frontal/maxillary tenderness ENT/Mouth: Ext aud canals clear, TMs without erythema, bulging. No erythema, swelling, or exudate on post pharynx.  Hearing normal.  Neck: Supple, thyroid normal.  Respiratory: Respiratory effort normal, BS equal bilaterally without rales, rhonchi, wheezing or stridor.  Cardio: RRR with no MRGs. Brisk peripheral pulses without edema.  Abdomen: Soft, + BS.  Non tender, no guarding, rebound, hernias, masses. Lymphatics: Non tender without lymphadenopathy.  Musculoskeletal: Full ROM, 5/5 strength, slight antalgic gait. Left knee slightly swollen Skin: Warm, dry without rashes, lesions, ecchymosis.  Neuro: Cranial nerves intact. No cerebellar  symptoms.  Psych: Awake and oriented X 3, normal affect, Insight and Judgment appropriate.    Patrick Dick, NP 3:41 PM [facility]

## 2023-09-05 ENCOUNTER — Encounter: Payer: Self-pay | Admitting: Nurse Practitioner

## 2023-09-05 ENCOUNTER — Ambulatory Visit (INDEPENDENT_AMBULATORY_CARE_PROVIDER_SITE_OTHER): Payer: BC Managed Care – PPO | Admitting: Nurse Practitioner

## 2023-09-05 VITALS — BP 112/78 | HR 61 | Temp 97.6°F | Ht 72.0 in | Wt 195.8 lb

## 2023-09-05 DIAGNOSIS — R7309 Other abnormal glucose: Secondary | ICD-10-CM | POA: Diagnosis not present

## 2023-09-05 DIAGNOSIS — M1712 Unilateral primary osteoarthritis, left knee: Secondary | ICD-10-CM

## 2023-09-05 DIAGNOSIS — Z79899 Other long term (current) drug therapy: Secondary | ICD-10-CM

## 2023-09-05 DIAGNOSIS — E782 Mixed hyperlipidemia: Secondary | ICD-10-CM | POA: Diagnosis not present

## 2023-09-05 DIAGNOSIS — E559 Vitamin D deficiency, unspecified: Secondary | ICD-10-CM

## 2023-09-05 DIAGNOSIS — R0989 Other specified symptoms and signs involving the circulatory and respiratory systems: Secondary | ICD-10-CM

## 2023-09-05 DIAGNOSIS — E663 Overweight: Secondary | ICD-10-CM

## 2023-09-05 NOTE — Patient Instructions (Signed)

## 2023-09-05 NOTE — Telephone Encounter (Signed)
This is his insurance card

## 2023-09-06 LAB — COMPLETE METABOLIC PANEL WITH GFR
AG Ratio: 1.6 (calc) (ref 1.0–2.5)
ALT: 24 U/L (ref 9–46)
AST: 23 U/L (ref 10–35)
Albumin: 4.4 g/dL (ref 3.6–5.1)
Alkaline phosphatase (APISO): 55 U/L (ref 35–144)
BUN/Creatinine Ratio: 18 (calc) (ref 6–22)
BUN: 24 mg/dL (ref 7–25)
CO2: 32 mmol/L (ref 20–32)
Calcium: 9.9 mg/dL (ref 8.6–10.3)
Chloride: 100 mmol/L (ref 98–110)
Creat: 1.32 mg/dL — ABNORMAL HIGH (ref 0.70–1.30)
Globulin: 2.7 g/dL (ref 1.9–3.7)
Glucose, Bld: 100 mg/dL — ABNORMAL HIGH (ref 65–99)
Potassium: 4.4 mmol/L (ref 3.5–5.3)
Sodium: 139 mmol/L (ref 135–146)
Total Bilirubin: 0.6 mg/dL (ref 0.2–1.2)
Total Protein: 7.1 g/dL (ref 6.1–8.1)
eGFR: 63 mL/min/{1.73_m2} (ref >=60)

## 2023-09-06 LAB — MAGNESIUM: Magnesium: 1.9 mg/dL (ref 1.5–2.5)

## 2023-09-06 LAB — LIPID PANEL
Cholesterol: 171 mg/dL (ref <200)
HDL: 54 mg/dL (ref >=40)
LDL Cholesterol (Calc): 89 mg/dL
Non-HDL Cholesterol (Calc): 117 mg/dL (ref <130)
Total CHOL/HDL Ratio: 3.2 (calc) (ref <5.0)
Triglycerides: 185 mg/dL — ABNORMAL HIGH (ref <150)

## 2023-09-06 LAB — CBC WITH DIFFERENTIAL/PLATELET
Absolute Lymphocytes: 2010 {cells}/uL (ref 850–3900)
Absolute Monocytes: 603 {cells}/uL (ref 200–950)
Basophils Absolute: 54 {cells}/uL (ref 0–200)
Basophils Relative: 0.8 %
Eosinophils Absolute: 194 {cells}/uL (ref 15–500)
Eosinophils Relative: 2.9 %
HCT: 42.8 % (ref 38.5–50.0)
Hemoglobin: 14.5 g/dL (ref 13.2–17.1)
MCH: 31.9 pg (ref 27.0–33.0)
MCHC: 33.9 g/dL (ref 32.0–36.0)
MCV: 94.3 fL (ref 80.0–100.0)
MPV: 11.1 fL (ref 7.5–12.5)
Monocytes Relative: 9 %
Neutro Abs: 3839 {cells}/uL (ref 1500–7800)
Neutrophils Relative %: 57.3 %
Platelets: 254 10*3/uL (ref 140–400)
RBC: 4.54 10*6/uL (ref 4.20–5.80)
RDW: 11.2 % (ref 11.0–15.0)
Total Lymphocyte: 30 %
WBC: 6.7 10*3/uL (ref 3.8–10.8)

## 2023-12-17 ENCOUNTER — Other Ambulatory Visit: Payer: Self-pay

## 2023-12-17 DIAGNOSIS — E782 Mixed hyperlipidemia: Secondary | ICD-10-CM

## 2023-12-17 MED ORDER — ROSUVASTATIN CALCIUM 20 MG PO TABS: ORAL_TABLET | ORAL | 0 refills | Status: AC

## 2024-03-09 ENCOUNTER — Encounter: Payer: BC Managed Care – PPO | Admitting: Internal Medicine
